# Patient Record
Sex: Female | Born: 1965 | Race: Black or African American | Hispanic: No | Marital: Married | Smoking: Never smoker
Health system: Southern US, Community
[De-identification: ages and names within clinical notes are randomized; demographics above are authoritative.]

## PROBLEM LIST (undated history)

## (undated) DIAGNOSIS — F329 Major depressive disorder, single episode, unspecified: Secondary | ICD-10-CM

## (undated) DIAGNOSIS — G894 Chronic pain syndrome: Secondary | ICD-10-CM

## (undated) DIAGNOSIS — IMO0001 Reserved for inherently not codable concepts without codable children: Secondary | ICD-10-CM

## (undated) DIAGNOSIS — R51 Headache: Secondary | ICD-10-CM

## (undated) DIAGNOSIS — S83207A Unspecified tear of unspecified meniscus, current injury, left knee, initial encounter: Secondary | ICD-10-CM

## (undated) DIAGNOSIS — Z8781 Personal history of (healed) traumatic fracture: Secondary | ICD-10-CM

## (undated) DIAGNOSIS — M1990 Unspecified osteoarthritis, unspecified site: Secondary | ICD-10-CM

## (undated) DIAGNOSIS — F32A Depression, unspecified: Secondary | ICD-10-CM

## (undated) DIAGNOSIS — S83206A Unspecified tear of unspecified meniscus, current injury, right knee, initial encounter: Secondary | ICD-10-CM

## (undated) DIAGNOSIS — Z87820 Personal history of traumatic brain injury: Secondary | ICD-10-CM

## (undated) DIAGNOSIS — R519 Headache, unspecified: Secondary | ICD-10-CM

## (undated) HISTORY — PX: FRACTURE SURGERY: SHX138

---

## 1994-02-09 HISTORY — PX: HALO APPLICATION: SHX1720

## 1994-02-09 HISTORY — PX: OTHER SURGICAL HISTORY: SHX169

## 1998-06-05 HISTORY — PX: TUBAL LIGATION: SHX77

## 2002-06-05 HISTORY — PX: LAPAROSCOPIC CHOLECYSTECTOMY: SUR755

## 2012-03-14 ENCOUNTER — Emergency Department (HOSPITAL_COMMUNITY)
Admission: EM | Admit: 2012-03-14 | Discharge: 2012-03-15 | Disposition: A | Discharge status: Home / Self Care | Payer: Medicaid Other | Attending: Emergency Medicine | Admitting: Emergency Medicine

## 2012-03-14 ENCOUNTER — Encounter (HOSPITAL_COMMUNITY): Payer: Self-pay | Admitting: *Deleted

## 2012-03-14 DIAGNOSIS — M542 Cervicalgia: Secondary | ICD-10-CM | POA: Insufficient documentation

## 2012-03-14 MED ORDER — HYDROCODONE-ACETAMINOPHEN 5-325 MG PO TABS: 2.0000 | ORAL_TABLET | Freq: Three times a day (TID) | ORAL | Status: DC | PRN

## 2012-03-14 MED ORDER — ONDANSETRON 4 MG PO TBDP
4.0000 mg | ORAL_TABLET | Freq: Once | ORAL | Status: AC
  Administered 2012-03-14: 4 mg via ORAL
  Filled 2012-03-14: qty 1

## 2012-03-14 MED ORDER — DIAZEPAM 5 MG PO TABS
5.0000 mg | ORAL_TABLET | Freq: Once | ORAL | Status: AC
  Administered 2012-03-14: 5 mg via ORAL
  Filled 2012-03-14: qty 1

## 2012-03-14 MED ORDER — DEXAMETHASONE 6 MG PO TABS
10.0000 mg | ORAL_TABLET | Freq: Once | ORAL | Status: AC
  Administered 2012-03-14: 10 mg via ORAL
  Filled 2012-03-14: qty 1

## 2012-03-14 MED ORDER — HYDROCODONE-ACETAMINOPHEN 5-325 MG PO TABS
2.0000 | ORAL_TABLET | Freq: Once | ORAL | Status: AC
  Administered 2012-03-14: 2 via ORAL
  Filled 2012-03-14: qty 2

## 2012-03-14 MED ORDER — PROMETHAZINE HCL 25 MG PO TABS
25.0000 mg | ORAL_TABLET | Freq: Once | ORAL | Status: AC
  Administered 2012-03-15: 25 mg via ORAL
  Filled 2012-03-14: qty 1

## 2012-03-14 MED ORDER — DIAZEPAM 5 MG PO TABS: 5.0000 mg | ORAL_TABLET | Freq: Two times a day (BID) | ORAL | Status: DC | PRN

## 2012-03-14 MED ORDER — PROMETHAZINE HCL 25 MG PO TABS: 25.0000 mg | ORAL_TABLET | Freq: Three times a day (TID) | ORAL | Status: DC | PRN
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
## 2012-03-14 NOTE — ED Notes (Signed)
Pt complains of photophobia. Pt reports taking a Midol this afternoon. Last dose of Flexeril at 7 pm. Pt reports no relief with home medications
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
## 2013-03-14 ENCOUNTER — Emergency Department (HOSPITAL_COMMUNITY): Payer: Self-pay

## 2013-03-14 ENCOUNTER — Encounter (HOSPITAL_COMMUNITY): Payer: Self-pay | Admitting: Emergency Medicine

## 2013-03-14 DIAGNOSIS — M7989 Other specified soft tissue disorders: Secondary | ICD-10-CM | POA: Insufficient documentation

## 2013-03-14 DIAGNOSIS — Z8781 Personal history of (healed) traumatic fracture: Secondary | ICD-10-CM | POA: Insufficient documentation

## 2013-03-14 DIAGNOSIS — R5381 Other malaise: Secondary | ICD-10-CM | POA: Insufficient documentation

## 2013-03-14 DIAGNOSIS — Z79899 Other long term (current) drug therapy: Secondary | ICD-10-CM | POA: Insufficient documentation

## 2013-03-14 DIAGNOSIS — E119 Type 2 diabetes mellitus without complications: Secondary | ICD-10-CM | POA: Insufficient documentation

## 2013-03-14 DIAGNOSIS — N898 Other specified noninflammatory disorders of vagina: Secondary | ICD-10-CM | POA: Insufficient documentation

## 2013-03-14 DIAGNOSIS — R072 Precordial pain: Secondary | ICD-10-CM | POA: Insufficient documentation

## 2013-03-14 DIAGNOSIS — R0602 Shortness of breath: Secondary | ICD-10-CM | POA: Insufficient documentation

## 2013-03-14 DIAGNOSIS — I10 Essential (primary) hypertension: Secondary | ICD-10-CM | POA: Insufficient documentation

## 2013-03-14 LAB — BASIC METABOLIC PANEL
BUN: 12 mg/dL (ref 6–23)
CO2: 23 mEq/L (ref 19–32)
Chloride: 104 mEq/L (ref 96–112)
GFR calc Af Amer: 89 mL/min — ABNORMAL LOW (ref >90)
Potassium: 3.6 mEq/L (ref 3.5–5.1)

## 2013-03-14 LAB — CBC
HCT: 34.2 % — ABNORMAL LOW (ref 36.0–46.0)
Platelets: 246 10*3/uL (ref 150–400)
RBC: 4.36 MIL/uL (ref 3.87–5.11)
RDW: 14.7 % (ref 11.5–15.5)
WBC: 6.1 10*3/uL (ref 4.0–10.5)

## 2013-03-14 LAB — POCT I-STAT TROPONIN I

## 2013-03-14 LAB — LIPASE, BLOOD: Lipase: 45 U/L (ref 11–59)

## 2013-03-14 NOTE — ED Notes (Signed)
Presents with fatigue, left sided chest pain under the breast, increased thirst and increased urination associated with SOB, also reports vaginal bleeding after intercourse and upper abdominal pain for the past 2 weeks.  aslo reports dizziness and having to elevate head of bedn when lying down to feeling SOB, blurred vision.

## 2013-03-15 ENCOUNTER — Emergency Department (HOSPITAL_COMMUNITY): Payer: Medicaid Other

## 2013-03-15 ENCOUNTER — Emergency Department (HOSPITAL_COMMUNITY)
Admission: EM | Admit: 2013-03-15 | Discharge: 2013-03-15 | Disposition: A | Discharge status: Home / Self Care | Payer: Self-pay | Attending: Emergency Medicine | Admitting: Emergency Medicine

## 2013-03-15 ENCOUNTER — Encounter (HOSPITAL_COMMUNITY): Payer: Medicaid Other

## 2013-03-15 ENCOUNTER — Ambulatory Visit (HOSPITAL_COMMUNITY)
Admission: RE | Admit: 2013-03-15 | Discharge: 2013-03-15 | Disposition: A | Discharge status: Home / Self Care | Payer: Medicaid Other | Source: Ambulatory Visit | Attending: Emergency Medicine | Admitting: Emergency Medicine

## 2013-03-15 DIAGNOSIS — M7989 Other specified soft tissue disorders: Secondary | ICD-10-CM

## 2013-03-15 DIAGNOSIS — R079 Chest pain, unspecified: Secondary | ICD-10-CM

## 2013-03-15 LAB — URINALYSIS, ROUTINE W REFLEX MICROSCOPIC
Bilirubin Urine: NEGATIVE
Hgb urine dipstick: NEGATIVE
Protein, ur: NEGATIVE mg/dL
Urobilinogen, UA: 0.2 mg/dL (ref 0.0–1.0)

## 2013-03-15 LAB — GLUCOSE, CAPILLARY: Glucose-Capillary: 87 mg/dL (ref 70–99)

## 2013-03-15 MED ORDER — ASPIRIN 81 MG PO CHEW: 81.0000 mg | CHEWABLE_TABLET | Freq: Every day | ORAL | Status: DC

## 2013-03-15 MED ORDER — IOHEXOL 350 MG/ML SOLN
100.0000 mL | Freq: Once | INTRAVENOUS | Status: AC | PRN
  Administered 2013-03-15: 100 mL via INTRAVENOUS

## 2013-03-15 MED ORDER — SODIUM CHLORIDE 0.9 % IV BOLUS (SEPSIS)
1000.0000 mL | Freq: Once | INTRAVENOUS | Status: AC
  Administered 2013-03-15: 1000 mL via INTRAVENOUS

## 2013-03-15 MED ORDER — ASPIRIN 81 MG PO CHEW
162.0000 mg | CHEWABLE_TABLET | Freq: Once | ORAL | Status: AC
  Administered 2013-03-15: 162 mg via ORAL
  Filled 2013-03-15: qty 2

## 2013-03-15 MED ORDER — HYDROCODONE-ACETAMINOPHEN 5-325 MG PO TABS: 1.0000 | ORAL_TABLET | Freq: Four times a day (QID) | ORAL | Status: DC | PRN

## 2013-03-15 MED ORDER — HYDROCODONE-ACETAMINOPHEN 5-325 MG PO TABS
2.0000 | ORAL_TABLET | Freq: Once | ORAL | Status: AC
  Administered 2013-03-15: 2 via ORAL
  Filled 2013-03-15: qty 2
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
## 2013-08-25 ENCOUNTER — Emergency Department (HOSPITAL_COMMUNITY)
Admission: EM | Admit: 2013-08-25 | Discharge: 2013-08-26 | Disposition: A | Discharge status: Home / Self Care | Payer: 59 | Attending: Emergency Medicine | Admitting: Emergency Medicine

## 2013-08-25 ENCOUNTER — Emergency Department (HOSPITAL_COMMUNITY): Payer: 59

## 2013-08-25 ENCOUNTER — Encounter (HOSPITAL_COMMUNITY): Payer: Self-pay | Admitting: Emergency Medicine

## 2013-08-25 DIAGNOSIS — Z79899 Other long term (current) drug therapy: Secondary | ICD-10-CM | POA: Insufficient documentation

## 2013-08-25 DIAGNOSIS — R42 Dizziness and giddiness: Secondary | ICD-10-CM | POA: Insufficient documentation

## 2013-08-25 DIAGNOSIS — R0789 Other chest pain: Secondary | ICD-10-CM | POA: Insufficient documentation

## 2013-08-25 DIAGNOSIS — Z87828 Personal history of other (healed) physical injury and trauma: Secondary | ICD-10-CM | POA: Insufficient documentation

## 2013-08-25 LAB — COMPREHENSIVE METABOLIC PANEL
ALK PHOS: 70 U/L (ref 39–117)
ALT: 19 U/L (ref 0–35)
AST: 19 U/L (ref 0–37)
Albumin: 3.8 g/dL (ref 3.5–5.2)
BILIRUBIN TOTAL: 1 mg/dL (ref 0.3–1.2)
BUN: 9 mg/dL (ref 6–23)
CO2: 20 meq/L (ref 19–32)
CREATININE: 0.81 mg/dL (ref 0.50–1.10)
Calcium: 9.6 mg/dL (ref 8.4–10.5)
Chloride: 102 mEq/L (ref 96–112)
GFR, EST NON AFRICAN AMERICAN: 86 mL/min — AB (ref >90)
Glucose, Bld: 78 mg/dL (ref 70–99)
POTASSIUM: 4.2 meq/L (ref 3.7–5.3)
Sodium: 138 mEq/L (ref 137–147)
Total Protein: 7.3 g/dL (ref 6.0–8.3)

## 2013-08-25 LAB — CBC
HEMATOCRIT: 36.5 % (ref 36.0–46.0)
Hemoglobin: 13.1 g/dL (ref 12.0–15.0)
MCH: 28.5 pg (ref 26.0–34.0)
MCHC: 35.9 g/dL (ref 30.0–36.0)
MCV: 79.3 fL (ref 78.0–100.0)
Platelets: 193 10*3/uL (ref 150–400)
RBC: 4.6 MIL/uL (ref 3.87–5.11)
RDW: 15.3 % (ref 11.5–15.5)
WBC: 6.9 10*3/uL (ref 4.0–10.5)

## 2013-08-25 LAB — URINE MICROSCOPIC-ADD ON

## 2013-08-25 LAB — URINALYSIS, ROUTINE W REFLEX MICROSCOPIC
BILIRUBIN URINE: NEGATIVE
Glucose, UA: NEGATIVE mg/dL
HGB URINE DIPSTICK: NEGATIVE
KETONES UR: NEGATIVE mg/dL
NITRITE: NEGATIVE
PROTEIN: NEGATIVE mg/dL
SPECIFIC GRAVITY, URINE: 1.019 (ref 1.005–1.030)
UROBILINOGEN UA: 0.2 mg/dL (ref 0.0–1.0)
pH: 6 (ref 5.0–8.0)

## 2013-08-25 LAB — TROPONIN I

## 2013-08-25 LAB — D-DIMER, QUANTITATIVE: D-Dimer, Quant: 0.58 ug/mL-FEU — ABNORMAL HIGH (ref 0.00–0.48)

## 2013-08-25 LAB — I-STAT TROPONIN, ED: TROPONIN I, POC: 0 ng/mL (ref 0.00–0.08)

## 2013-08-25 MED ORDER — GI COCKTAIL ~~LOC~~
30.0000 mL | Freq: Once | ORAL | Status: AC
  Administered 2013-08-25: 30 mL via ORAL
  Filled 2013-08-25: qty 30

## 2013-08-25 MED ORDER — IBUPROFEN 800 MG PO TABS: 800.0000 mg | ORAL_TABLET | Freq: Three times a day (TID) | ORAL | Status: DC

## 2013-08-25 MED ORDER — ASPIRIN 81 MG PO CHEW
324.0000 mg | CHEWABLE_TABLET | Freq: Once | ORAL | Status: AC
  Administered 2013-08-25: 324 mg via ORAL
  Filled 2013-08-25: qty 4

## 2013-08-25 MED ORDER — IOHEXOL 350 MG/ML SOLN
100.0000 mL | Freq: Once | INTRAVENOUS | Status: AC | PRN
  Administered 2013-08-25: 100 mL via INTRAVENOUS

## 2013-08-25 NOTE — ED Notes (Signed)
Patient transported to CT 

## 2013-08-25 NOTE — ED Notes (Signed)
IV team at bedside 

## 2013-08-25 NOTE — ED Notes (Signed)
Unsuccessful ultrasound guided IV attempts x 2. EDP aware

## 2013-08-25 NOTE — ED Notes (Signed)
Ultrasound IV unsuccessful, Dr. Wyvonnia Dusky notified.

## 2013-08-25 NOTE — ED Notes (Signed)
Pt urinated but forgot to give specimen, states she will try again later.  20 gauge IV attempted X 1 without success IV team paged.

## 2013-08-25 NOTE — Discharge Instructions (Signed)
Chest Wall Pain There is no evidence of heart attack or blood clot in the lung. Follow up with your doctor. Return to the ED if you develop new or worsening symptoms. Chest wall pain is pain in or around the bones and muscles of your chest. It may take up to 6 weeks to get better. It may take longer if you must stay physically active in your work and activities.  CAUSES  Chest wall pain may happen on its own. However, it may be caused by:  A viral illness like the flu.  Injury.  Coughing.  Exercise.  Arthritis.  Fibromyalgia.  Shingles. HOME CARE INSTRUCTIONS   Avoid overtiring physical activity. Try not to strain or perform activities that cause pain. This includes any activities using your chest or your abdominal and side muscles, especially if heavy weights are used.  Put ice on the sore area.  Put ice in a plastic bag.  Place a towel between your skin and the bag.  Leave the ice on for 15-20 minutes per hour while awake for the first 2 days.  Only take over-the-counter or prescription medicines for pain, discomfort, or fever as directed by your caregiver. SEEK IMMEDIATE MEDICAL CARE IF:   Your pain increases, or you are very uncomfortable.  You have a fever.  Your chest pain becomes worse.  You have new, unexplained symptoms.  You have nausea or vomiting.  You feel sweaty or lightheaded.  You have a cough with phlegm (sputum), or you cough up blood. MAKE SURE YOU:   Understand these instructions.  Will watch your condition.  Will get help right away if you are not doing well or get worse. Document Released: 05/22/2005 Document Revised: 08/14/2011 Document Reviewed: 01/16/2011 [facility] Patient Information 2014 [place], [place].
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
## 2013-08-25 NOTE — ED Notes (Signed)
Hard sharp cp that just started today while she was visiting a friend at hospital no n/v  Some sob

## 2013-08-25 NOTE — ED Notes (Addendum)
Flushed pt IV, pt denies pain. Called CT to inform pt agreeing to go back to CT for scan.

## 2013-08-25 NOTE — ED Notes (Signed)
Paged IV team for 20G placement for CT angio.

## 2013-08-25 NOTE — ED Notes (Signed)
Pt returned from CT. Was unable to perform scan because pt reports pain while flushing IV. CT denies IV infiltrated.

## 2013-08-25 NOTE — ED Notes (Signed)
Called CT to inform pt has 20G IV and ready for scan.

## 2013-08-25 NOTE — ED Notes (Addendum)
Randall Hiss, RN at bedside attempting to insert IV via ultrasound.

## 2013-08-25 NOTE — ED Notes (Signed)
Pt returned from CT. Unable to perform scan due to pain when flushing IV. Dr. Wyvonnia Dusky notified.

## 2013-08-26 ENCOUNTER — Emergency Department (HOSPITAL_COMMUNITY): Payer: 59

## 2013-08-26 MED ORDER — TECHNETIUM TC 99M DIETHYLENETRIAME-PENTAACETIC ACID: 40.0000 | Freq: Once | INTRAVENOUS | Status: DC | PRN

## 2013-08-26 MED ORDER — TECHNETIUM TO 99M ALBUMIN AGGREGATED
6.0000 | Freq: Once | INTRAVENOUS | Status: AC | PRN
  Administered 2013-08-26: 6 via INTRAVENOUS

## 2013-08-26 NOTE — ED Notes (Signed)
Pt back from NM.

## 2013-08-26 NOTE — ED Provider Notes (Signed)
Nursing notes and vitals signs, including pulse oximetry, reviewed.  Summary of this visit's results, reviewed by myself:  Labs:  Results for orders placed during the hospital encounter of 08/25/13 (from the past 24 hour(s))  CBC     Status: None   Collection Time    08/25/13  3:29 PM      Result Value Ref Range   WBC 6.9  4.0 - 10.5 K/uL   RBC 4.60  3.87 - 5.11 MIL/uL   Hemoglobin 13.1  12.0 - 15.0 g/dL   HCT 36.5  36.0 - 46.0 %   MCV 79.3  78.0 - 100.0 fL   MCH 28.5  26.0 - 34.0 pg   MCHC 35.9  30.0 - 36.0 g/dL   RDW 15.3  11.5 - 15.5 %   Platelets 193  150 - 400 K/uL  COMPREHENSIVE METABOLIC PANEL     Status: Abnormal   Collection Time    08/25/13  3:29 PM      Result Value Ref Range   Sodium 138  137 - 147 mEq/L   Potassium 4.2  3.7 - 5.3 mEq/L   Chloride 102  96 - 112 mEq/L   CO2 20  19 - 32 mEq/L   Glucose, Bld 78  70 - 99 mg/dL   BUN 9  6 - 23 mg/dL   Creatinine, Ser 0.81  0.50 - 1.10 mg/dL   Calcium 9.6  8.4 - 10.5 mg/dL   Total Protein 7.3  6.0 - 8.3 g/dL   Albumin 3.8  3.5 - 5.2 g/dL   AST 19  0 - 37 U/L   ALT 19  0 - 35 U/L   Alkaline Phosphatase 70  39 - 117 U/L   Total Bilirubin 1.0  0.3 - 1.2 mg/dL   GFR calc non Af Amer 86 (*) >90 mL/min   GFR calc Af Amer >90  >90 mL/min  I-STAT TROPOININ, ED     Status: None   Collection Time    08/25/13  4:03 PM      Result Value Ref Range   Troponin i, poc 0.00  0.00 - 0.08 ng/mL   Comment 3           D-DIMER, QUANTITATIVE     Status: Abnormal   Collection Time    08/25/13  4:06 PM      Result Value Ref Range   D-Dimer, Quant 0.58 (*) 0.00 - 0.48 ug/mL-FEU  TROPONIN I     Status: None   Collection Time    08/25/13  6:44 PM      Result Value Ref Range   Troponin I <0.30  <0.30 ng/mL  URINALYSIS, ROUTINE W REFLEX MICROSCOPIC     Status: Abnormal   Collection Time    08/25/13  6:50 PM      Result Value Ref Range   Color, Urine YELLOW  YELLOW   APPearance CLEAR  CLEAR   Specific Gravity, Urine 1.019  1.005 -  1.030   pH 6.0  5.0 - 8.0   Glucose, UA NEGATIVE  NEGATIVE mg/dL   Hgb urine dipstick NEGATIVE  NEGATIVE   Bilirubin Urine NEGATIVE  NEGATIVE   Ketones, ur NEGATIVE  NEGATIVE mg/dL   Protein, ur NEGATIVE  NEGATIVE mg/dL   Urobilinogen, UA 0.2  0.0 - 1.0 mg/dL   Nitrite NEGATIVE  NEGATIVE   Leukocytes, UA SMALL (*) NEGATIVE  URINE MICROSCOPIC-ADD ON     Status: Abnormal   Collection Time  08/25/13  6:50 PM      Result Value Ref Range   Squamous Epithelial / LPF FEW (*) RARE   WBC, UA 3-6  <3 WBC/hpf   Bacteria, UA FEW (*) RARE   Urine-Other MUCOUS PRESENT      Imaging Studies: Dg Chest 2 View  08/25/2013   CLINICAL DATA:  Midsternal chest Pain began several hr ago.  EXAM: CHEST  2 VIEW  COMPARISON:  03/14/2013 and 03/15/2013  FINDINGS: The heart size and mediastinal contours are within normal limits. Both lungs are clear. The visualized skeletal structures are unremarkable. Surgical clips are noted in the upper abdomen.  IMPRESSION: No active cardiopulmonary disease.   Electronically Signed   By: Shon Hale M.D.   On: 08/25/2013 17:15   Nm Pulmonary Perf And Vent  08/26/2013   CLINICAL DATA:  Pain, shortness of breath.  EXAM: NUCLEAR MEDICINE VENTILATION - PERFUSION LUNG SCAN  TECHNIQUE: Ventilation images were obtained in multiple projections using inhaled aerosol technetium 99 M DTPA. Perfusion images were obtained in multiple projections after intravenous injection of Tc-2m MAA.  RADIOPHARMACEUTICALS:  40 mCi Tc-7m DTPA aerosol and 6 mCi Tc-42m MAA  COMPARISON:  DG CHEST 2 VIEW dated 08/25/2013; CT ANGIO CHEST W/CM &/OR WO/CM dated 03/15/2013  FINDINGS: Ventilation: No focal ventilation defect.  Perfusion: No wedge shaped peripheral perfusion defects to suggest acute pulmonary embolism. Non segmental matched ventilation and perfusion defect in the right mid lung could reflect fissure though is non specific.  IMPRESSION: Low probability V/Q scan.   Electronically Signed   By: Elon Alas   On: 08/26/2013 01:46      Wynetta Fines, MD 08/26/13 2706

## 2013-08-26 NOTE — ED Notes (Signed)
Pt taken to Nuclear med for VQ scan.

## 2013-08-26 NOTE — ED Notes (Signed)
Pt upset at discharge due to being told by MD that she was going to be kept.  This RN and MD explained to pt that there was no need to keep her in emergency department.  Pt unable to get into contact with husband for pickup.  This RN offered pt assistance in getting home.  Unable to do so due to limited resources.  Pt wanting to speak with MD again.  MD unavailable.  Pt left department visibly upset.  Agricultural consultant and AD notified.

## 2013-08-28 ENCOUNTER — Other Ambulatory Visit: Payer: Self-pay | Admitting: Orthopedic Surgery

## 2013-08-29 ENCOUNTER — Encounter (HOSPITAL_BASED_OUTPATIENT_CLINIC_OR_DEPARTMENT_OTHER): Payer: Self-pay | Admitting: *Deleted

## 2013-09-02 ENCOUNTER — Encounter (HOSPITAL_BASED_OUTPATIENT_CLINIC_OR_DEPARTMENT_OTHER): Payer: Self-pay | Admitting: *Deleted

## 2013-09-02 NOTE — Progress Notes (Addendum)
NPO AFTER MN WITH EXCEPTION CLEAR LIQUIDS UNTIL 0700 (NO CREAM/ MILK PRODUCTS).  ARRIVE AT 1115.  CURRENT LAB RESULTS IN EPIC AND CHART.   WILL TAKE FLEXERIL AND TRAMADOL AM DOS W/ SIPS OF WATER. 

## 2013-09-09 ENCOUNTER — Encounter (HOSPITAL_BASED_OUTPATIENT_CLINIC_OR_DEPARTMENT_OTHER): Payer: Self-pay | Admitting: Anesthesiology

## 2013-09-09 ENCOUNTER — Encounter (HOSPITAL_BASED_OUTPATIENT_CLINIC_OR_DEPARTMENT_OTHER): Payer: 59 | Admitting: Anesthesiology

## 2013-09-09 ENCOUNTER — Ambulatory Visit (HOSPITAL_BASED_OUTPATIENT_CLINIC_OR_DEPARTMENT_OTHER): Payer: 59 | Admitting: Anesthesiology

## 2013-09-09 ENCOUNTER — Encounter (HOSPITAL_BASED_OUTPATIENT_CLINIC_OR_DEPARTMENT_OTHER)
Admission: RE | Disposition: A | Discharge status: Home / Self Care | Payer: Self-pay | Source: Ambulatory Visit | Attending: Specialist

## 2013-09-09 ENCOUNTER — Ambulatory Visit (HOSPITAL_BASED_OUTPATIENT_CLINIC_OR_DEPARTMENT_OTHER)
Admission: RE | Admit: 2013-09-09 | Discharge: 2013-09-09 | Disposition: A | Discharge status: Home / Self Care | Payer: 59 | Source: Ambulatory Visit | Attending: Specialist | Admitting: Specialist

## 2013-09-09 DIAGNOSIS — X58XXXA Exposure to other specified factors, initial encounter: Secondary | ICD-10-CM | POA: Insufficient documentation

## 2013-09-09 DIAGNOSIS — Z9889 Other specified postprocedural states: Secondary | ICD-10-CM

## 2013-09-09 DIAGNOSIS — IMO0002 Reserved for concepts with insufficient information to code with codable children: Secondary | ICD-10-CM | POA: Insufficient documentation

## 2013-09-09 DIAGNOSIS — M1710 Unilateral primary osteoarthritis, unspecified knee: Secondary | ICD-10-CM | POA: Insufficient documentation

## 2013-09-09 DIAGNOSIS — M2240 Chondromalacia patellae, unspecified knee: Secondary | ICD-10-CM | POA: Insufficient documentation

## 2013-09-09 DIAGNOSIS — E669 Obesity, unspecified: Secondary | ICD-10-CM | POA: Insufficient documentation

## 2013-09-09 HISTORY — DX: Reserved for inherently not codable concepts without codable children: IMO0001

## 2013-09-09 HISTORY — DX: Chronic pain syndrome: G89.4

## 2013-09-09 HISTORY — DX: Personal history of traumatic brain injury: Z87.820

## 2013-09-09 HISTORY — DX: Unspecified tear of unspecified meniscus, current injury, left knee, initial encounter: S83.207A

## 2013-09-09 HISTORY — DX: Personal history of (healed) traumatic fracture: Z87.81

## 2013-09-09 HISTORY — PX: KNEE ARTHROSCOPY WITH MEDIAL MENISECTOMY: SHX5651

## 2013-09-09 SURGERY — ARTHROSCOPY, KNEE, WITH MEDIAL MENISCECTOMY
Anesthesia: General | Site: Knee | Laterality: Left

## 2013-09-09 MED ORDER — ONDANSETRON HCL 4 MG/2ML IJ SOLN
INTRAMUSCULAR | Status: DC | PRN
  Administered 2013-09-09: 4 mg via INTRAVENOUS

## 2013-09-09 MED ORDER — CHLORHEXIDINE GLUCONATE 4 % EX LIQD
60.0000 mL | Freq: Once | CUTANEOUS | Status: DC
  Filled 2013-09-09: qty 60

## 2013-09-09 MED ORDER — DEXAMETHASONE SODIUM PHOSPHATE 4 MG/ML IJ SOLN
INTRAMUSCULAR | Status: DC | PRN
  Administered 2013-09-09: 10 mg via INTRAVENOUS

## 2013-09-09 MED ORDER — SODIUM CHLORIDE 0.9 % IR SOLN
Status: DC | PRN
  Administered 2013-09-09: 6000 mL

## 2013-09-09 MED ORDER — LACTATED RINGERS IV SOLN
INTRAVENOUS | Status: DC
  Administered 2013-09-09: 07:00:00 via INTRAVENOUS
  Filled 2013-09-09: qty 1000

## 2013-09-09 MED ORDER — ACETAMINOPHEN 10 MG/ML IV SOLN
INTRAVENOUS | Status: DC | PRN
  Administered 2013-09-09: 1000 mg via INTRAVENOUS

## 2013-09-09 MED ORDER — MORPHINE SULFATE 4 MG/ML IJ SOLN
INTRAMUSCULAR | Status: AC
  Filled 2013-09-09: qty 1

## 2013-09-09 MED ORDER — FENTANYL CITRATE 0.05 MG/ML IJ SOLN
INTRAMUSCULAR | Status: AC
  Filled 2013-09-09: qty 2

## 2013-09-09 MED ORDER — SODIUM CHLORIDE 0.9 % IV SOLN
INTRAVENOUS | Status: DC
  Filled 2013-09-09: qty 1000

## 2013-09-09 MED ORDER — FENTANYL CITRATE 0.05 MG/ML IJ SOLN
INTRAMUSCULAR | Status: AC
  Filled 2013-09-09: qty 4

## 2013-09-09 MED ORDER — FENTANYL CITRATE 0.05 MG/ML IJ SOLN
25.0000 ug | INTRAMUSCULAR | Status: DC | PRN
  Administered 2013-09-09 (×2): 50 ug via INTRAVENOUS
  Filled 2013-09-09: qty 1

## 2013-09-09 MED ORDER — LIDOCAINE HCL (CARDIAC) 20 MG/ML IV SOLN
INTRAVENOUS | Status: DC | PRN
  Administered 2013-09-09: 100 mg via INTRAVENOUS

## 2013-09-09 MED ORDER — CEFAZOLIN SODIUM-DEXTROSE 2-3 GM-% IV SOLR
2.0000 g | INTRAVENOUS | Status: AC
  Administered 2013-09-09: 2 g via INTRAVENOUS
  Filled 2013-09-09: qty 50

## 2013-09-09 MED ORDER — LACTATED RINGERS IV SOLN
INTRAVENOUS | Status: DC
  Administered 2013-09-09: 10:00:00 via INTRAVENOUS
  Filled 2013-09-09: qty 1000

## 2013-09-09 MED ORDER — HYDROCODONE-ACETAMINOPHEN 5-325 MG PO TABS: 1.0000 | ORAL_TABLET | Freq: Four times a day (QID) | ORAL | Status: DC | PRN

## 2013-09-09 MED ORDER — ASPIRIN EC 325 MG PO TBEC: 325.0000 mg | DELAYED_RELEASE_TABLET | Freq: Two times a day (BID) | ORAL | Status: DC

## 2013-09-09 MED ORDER — KETOROLAC TROMETHAMINE 30 MG/ML IJ SOLN
INTRAMUSCULAR | Status: DC | PRN
  Administered 2013-09-09: 30 mg via INTRAVENOUS

## 2013-09-09 MED ORDER — CEPHALEXIN 500 MG PO CAPS: 500.0000 mg | ORAL_CAPSULE | Freq: Three times a day (TID) | ORAL | Status: DC

## 2013-09-09 MED ORDER — MIDAZOLAM HCL 2 MG/2ML IJ SOLN
INTRAMUSCULAR | Status: AC
  Filled 2013-09-09: qty 2

## 2013-09-09 MED ORDER — MIDAZOLAM HCL 5 MG/5ML IJ SOLN
INTRAMUSCULAR | Status: DC | PRN
  Administered 2013-09-09: 2 mg via INTRAVENOUS

## 2013-09-09 MED ORDER — BUPIVACAINE HCL 0.25 % IJ SOLN
INTRAMUSCULAR | Status: DC | PRN
  Administered 2013-09-09: 25 mL

## 2013-09-09 MED ORDER — FENTANYL CITRATE 0.05 MG/ML IJ SOLN
INTRAMUSCULAR | Status: DC | PRN
  Administered 2013-09-09 (×2): 50 ug via INTRAVENOUS

## 2013-09-09 MED ORDER — MORPHINE SULFATE 4 MG/ML IJ SOLN
INTRAMUSCULAR | Status: DC | PRN
Start: 2013-09-09 — End: 2013-09-09
  Administered 2013-09-09: 4 mg via INTRAVENOUS

## 2013-09-09 MED ORDER — PROPOFOL 10 MG/ML IV BOLUS
INTRAVENOUS | Status: DC | PRN
  Administered 2013-09-09: 200 mg via INTRAVENOUS

## 2013-09-09 SURGICAL SUPPLY — 53 items
BANDAGE ESMARK 6X9 LF (GAUZE/BANDAGES/DRESSINGS) ×1 IMPLANT
BANDAGE GAUZE ELAST BULKY 4 IN (GAUZE/BANDAGES/DRESSINGS) ×2 IMPLANT
BLADE 4.2CUDA (BLADE) IMPLANT
BLADE CUDA GRT WHITE 3.5 (BLADE) ×2 IMPLANT
BLADE CUDA SHAVER 3.5 (BLADE) IMPLANT
BNDG ESMARK 6X9 LF (GAUZE/BANDAGES/DRESSINGS) ×2
BNDG GAUZE ELAST 4 BULKY (GAUZE/BANDAGES/DRESSINGS) ×4 IMPLANT
CANISTER SUCT LVC 12 LTR MEDI- (MISCELLANEOUS) ×2 IMPLANT
CANISTER SUCTION 1200CC (MISCELLANEOUS) ×2 IMPLANT
CLOTH BEACON ORANGE TIMEOUT ST (SAFETY) ×2 IMPLANT
CUFF TOURNIQUET SINGLE 34IN LL (TOURNIQUET CUFF) ×2 IMPLANT
DRAPE ARTHROSCOPY W/POUCH 114 (DRAPES) ×2 IMPLANT
DRAPE INCISE 23X17 IOBAN STRL (DRAPES) ×1
DRAPE INCISE IOBAN 23X17 STRL (DRAPES) ×1 IMPLANT
DRAPE INCISE IOBAN 66X45 STRL (DRAPES) ×2 IMPLANT
DRSG PAD ABDOMINAL 8X10 ST (GAUZE/BANDAGES/DRESSINGS) ×2 IMPLANT
DURAPREP 26ML APPLICATOR (WOUND CARE) ×2 IMPLANT
ELECT MENISCUS 165MM 90D (ELECTRODE) IMPLANT
ELECT REM PT RETURN 9FT ADLT (ELECTROSURGICAL)
ELECTRODE REM PT RTRN 9FT ADLT (ELECTROSURGICAL) IMPLANT
GAUZE XEROFORM 1X8 LF (GAUZE/BANDAGES/DRESSINGS) ×2 IMPLANT
GLOVE BIO SURGEON STRL SZ7.5 (GLOVE) ×2 IMPLANT
GLOVE BIOGEL M 6.5 STRL (GLOVE) ×2 IMPLANT
GLOVE BIOGEL PI IND STRL 6.5 (GLOVE) ×1 IMPLANT
GLOVE BIOGEL PI INDICATOR 6.5 (GLOVE) ×1
GLOVE INDICATOR 8.0 STRL GRN (GLOVE) ×4 IMPLANT
GLOVE SURG ORTHO 8.0 STRL STRW (GLOVE) ×2 IMPLANT
GOWN PREVENTION PLUS LG XLONG (DISPOSABLE) IMPLANT
GOWN STRL REIN XL XLG (GOWN DISPOSABLE) IMPLANT
GOWN STRL REUS W/TWL LRG LVL3 (GOWN DISPOSABLE) ×2 IMPLANT
GOWN STRL REUS W/TWL XL LVL3 (GOWN DISPOSABLE) ×4 IMPLANT
IMMOBILIZER KNEE 22 UNIV (SOFTGOODS) IMPLANT
IMMOBILIZER KNEE 24 THIGH 36 (MISCELLANEOUS) IMPLANT
IMMOBILIZER KNEE 24 UNIV (MISCELLANEOUS)
KNEE WRAP E Z 3 GEL PACK (MISCELLANEOUS) ×2 IMPLANT
MINI VAC (SURGICAL WAND) ×2 IMPLANT
NEEDLE HYPO 22GX1.5 SAFETY (NEEDLE) ×2 IMPLANT
PACK ARTHROSCOPY DSU (CUSTOM PROCEDURE TRAY) ×2 IMPLANT
PACK BASIN DAY SURGERY FS (CUSTOM PROCEDURE TRAY) ×2 IMPLANT
PAD ABD 8X10 STRL (GAUZE/BANDAGES/DRESSINGS) ×4 IMPLANT
PADDING CAST ABS 4INX4YD NS (CAST SUPPLIES) ×1
PADDING CAST ABS COTTON 4X4 ST (CAST SUPPLIES) ×1 IMPLANT
PENCIL BUTTON HOLSTER BLD 10FT (ELECTRODE) IMPLANT
SET ARTHROSCOPY TUBING (MISCELLANEOUS) ×1
SET ARTHROSCOPY TUBING LN (MISCELLANEOUS) ×1 IMPLANT
SPONGE GAUZE 4X4 12PLY (GAUZE/BANDAGES/DRESSINGS) ×2 IMPLANT
SPONGE GAUZE 4X4 12PLY STER LF (GAUZE/BANDAGES/DRESSINGS) ×2 IMPLANT
SUT ETHILON 4 0 PS 2 18 (SUTURE) ×2 IMPLANT
SYR CONTROL 10ML LL (SYRINGE) ×2 IMPLANT
TOWEL OR 17X24 6PK STRL BLUE (TOWEL DISPOSABLE) ×2 IMPLANT
WAND 30 DEG SABER W/CORD (SURGICAL WAND) IMPLANT
WAND 90 DEG TURBOVAC W/CORD (SURGICAL WAND) IMPLANT
WATER STERILE IRR 500ML POUR (IV SOLUTION) ×2 IMPLANT
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
## 2013-09-09 NOTE — Op Note (Signed)
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 Procedure 1 left knee arthroscopic partial medial meniscectomy #2 chondroplasty of medial femoral condyle Surgeon Hart Robinsons M.D. Assistant Jerilynn Mages PA-C Anesthesia Gen. intraoperative knee block Estimated blood loss minimal Tourniquet time 30 minutes 300 mm of mercury Complications none Disposition PACU stable  Operative details patient and husband counseled in the holding area cracks out marked. IV antibiotics given no way to operating room. In the OR placed supine placed under general anesthesia. And a thigh patient to thigh holder prepped with DuraPrep and draped into sterile fashion. Gershon Mussel out was done. Arthroscopic portals were established proximal medial inferomedial inferolateral. Diagnostic arthroscopy the patellofemoral joint but normal tracking and mild grade 2, weighs of the proximal aspect and a light chondroplasty was performed femoral trochlea unremarkable souped L. pouch medial lateral gutters unremarkable anterior posterior cruciate normal medial compartment inspected diffuse grade 3 chondromalacia medial femoral condyle unstable chondral flaps mechanical chondroplasty performed with a shaver. Complex tear posterior half of medial meniscus utilizing baskets and motorized shaver and cautery system a partial medial meniscectomy was performed smoothing back to a nice smooth edges well beveled and well contoured. Lateral side inspected lateral femoral condyle unremarkable lateral tibial plateau mild chondromalacia did not require chondroplasty lateral meniscus thoroughly intact arthroscopically. No other abnormalities noted irrigated arthroscopic equipment was removed. 5 cc of [place] sooner 0.25% Marcaine was placed into the skin and 20 cc of 0.25%  Marcaine with 4 mg of morphine sulfate was placed intra-articular. Sterile dressing was applied TED hose tourniquet was deflated normal circulation. Awakened taken from operating room to PACU in stable condition. No complications no problems.

## 2013-09-09 NOTE — Anesthesia Postprocedure Evaluation (Signed)
  Anesthesia Post-op Note  Patient: Latasha [name]  Procedure(s) Performed: Procedure(s) (LRB): LEFT KNEE ARTHROSCOPY WITH PARTIAL MEDIAL  MENISECTOMY AND CHONDROPLASTY (Left)  Patient Location: PACU  Anesthesia Type: General  Level of Consciousness: awake and alert   Airway and Oxygen Therapy: Patient Spontanous Breathing  Post-op Pain: mild  Post-op Assessment: Post-op Vital signs reviewed, Patient's Cardiovascular Status Stable, Respiratory Function Stable, Patent Airway and No signs of Nausea or vomiting  Last Vitals:  Filed Vitals:   09/09/13 0915  BP: 137/101  Pulse: 74  Temp:   Resp: 12    Post-op Vital Signs: stable   Complications: No apparent anesthesia complications

## 2013-09-09 NOTE — Anesthesia Procedure Notes (Signed)
Procedure Name: LMA Insertion Date/Time: 09/09/2013 8:00 AM Performed by: Mechele Claude Pre-anesthesia Checklist: Patient identified, Emergency Drugs available, Suction available and Patient being monitored Patient Re-evaluated:Patient Re-evaluated prior to inductionOxygen Delivery Method: Circle System Utilized Preoxygenation: Pre-oxygenation with 100% oxygen Intubation Type: IV induction Ventilation: Mask ventilation without difficulty LMA: LMA inserted LMA Size: 4.0 Number of attempts: 1 Airway Equipment and Method: bite block Placement Confirmation: positive ETCO2 Tube secured with: Tape Dental Injury: Teeth and Oropharynx as per pre-operative assessment

## 2013-09-09 NOTE — Transfer of Care (Signed)
Immediate Anesthesia Transfer of Care Note  Patient: Latasha [name]  Procedure(s) Performed: Procedure(s) (LRB): LEFT KNEE ARTHROSCOPY WITH PARTIAL MEDIAL  MENISECTOMY AND CHONDROPLASTY (Left)  Patient Location: PACU  Anesthesia Type: General  Level of Consciousness: awake, alert  and oriented  Airway & Oxygen Therapy: Patient Spontanous Breathing and Patient connected to face mask oxygen  Post-op Assessment: Report given to PACU RN and Post -op Vital signs reviewed and stable  Post vital signs: Reviewed and stable  Complications: No apparent anesthesia complications

## 2013-09-09 NOTE — Discharge Instructions (Signed)

## 2013-09-09 NOTE — H&P (View-Only) (Signed)
NPO AFTER MN WITH EXCEPTION CLEAR LIQUIDS UNTIL 0700 (NO CREAM/ MILK PRODUCTS).  ARRIVE AT 1115.  CURRENT LAB RESULTS IN EPIC AND CHART.   WILL TAKE FLEXERIL AND TRAMADOL AM DOS W/ SIPS OF WATER.

## 2013-09-09 NOTE — Interval H&P Note (Signed)
History and Physical Interval Note:  09/09/2013 7:53 AM  Latasha [name]  has presented today for surgery, with the diagnosis of left knee medial and lateral meniscus tear and oa  The various methods of treatment have been discussed with the patient and family. After consideration of risks, benefits and other options for treatment, the patient has consented to  Procedure(s): LEFT KNEE ARTHROSCOPY WITH PARTIAL MEDIAL AND LATERAL MENISECTOMY AND CHONDROPLASTY (Left) as a surgical intervention .  The patient's history has been reviewed, patient examined, no change in status, stable for surgery.  I have reviewed the patient's chart and labs.  Questions were answered to the patient's satisfaction.     Latasha [name] ANDREW

## 2013-09-09 NOTE — Anesthesia Preprocedure Evaluation (Addendum)
Anesthesia Evaluation  Patient identified by MRN, date of birth, ID band Patient awake    Reviewed: Allergy & Precautions, H&P , NPO status , Patient's Chart, lab work & pertinent test results  Airway Mallampati: II TM Distance: >3 FB Neck ROM: full    Dental no notable dental hx. (+) Caps, Dental Advisory Given Left upper front capped:   Pulmonary neg pulmonary ROS,  breath sounds clear to auscultation  Pulmonary exam normal       Cardiovascular Exercise Tolerance: Good negative cardio ROS  Rhythm:regular Rate:Normal     Neuro/Psych negative neurological ROS  negative psych ROS   GI/Hepatic negative GI ROS, Neg liver ROS,   Endo/Other  negative endocrine ROSMorbid obesity  Renal/GU negative Renal ROS  negative genitourinary   Musculoskeletal   Abdominal (+) + obese,   Peds  Hematology negative hematology ROS (+)   Anesthesia Other Findings   Reproductive/Obstetrics negative OB ROS                          Anesthesia Physical Anesthesia Plan  ASA: III  Anesthesia Plan: MAC   Post-op Pain Management:    Induction:   Airway Management Planned: Nasal Cannula  Additional Equipment:   Intra-op Plan:   Post-operative Plan:   Informed Consent: I have reviewed the patients History and Physical, chart, labs and discussed the procedure including the risks, benefits and alternatives for the proposed anesthesia with the patient or authorized representative who has indicated his/her understanding and acceptance.   Dental Advisory Given  Plan Discussed with: CRNA and Surgeon  Anesthesia Plan Comments:        Anesthesia Quick Evaluation

## 2013-09-10 ENCOUNTER — Encounter (HOSPITAL_BASED_OUTPATIENT_CLINIC_OR_DEPARTMENT_OTHER): Payer: Self-pay | Admitting: Specialist

## 2013-09-15 ENCOUNTER — Other Ambulatory Visit (HOSPITAL_COMMUNITY): Payer: Self-pay | Admitting: *Deleted

## 2013-09-15 ENCOUNTER — Ambulatory Visit (HOSPITAL_COMMUNITY)
Admission: RE | Admit: 2013-09-15 | Discharge: 2013-09-15 | Disposition: A | Discharge status: Home / Self Care | Payer: 59 | Source: Ambulatory Visit | Attending: Cardiology | Admitting: Cardiology

## 2013-09-15 DIAGNOSIS — M7989 Other specified soft tissue disorders: Principal | ICD-10-CM

## 2013-09-15 DIAGNOSIS — M79606 Pain in leg, unspecified: Secondary | ICD-10-CM

## 2013-09-15 DIAGNOSIS — R609 Edema, unspecified: Secondary | ICD-10-CM | POA: Insufficient documentation

## 2013-09-15 DIAGNOSIS — M79609 Pain in unspecified limb: Secondary | ICD-10-CM | POA: Insufficient documentation

## 2013-09-15 NOTE — Progress Notes (Signed)
Left Lower Ext. Venous Duplex Completed. Negative for DVT. Ayren Zumbro, BS, RDMS, RVT  

## 2013-09-19 ENCOUNTER — Telehealth (HOSPITAL_COMMUNITY): Payer: Self-pay | Admitting: *Deleted

## 2014-07-06 ENCOUNTER — Other Ambulatory Visit (HOSPITAL_COMMUNITY): Payer: Self-pay | Admitting: Orthopedic Surgery

## 2014-07-06 DIAGNOSIS — M25561 Pain in right knee: Secondary | ICD-10-CM

## 2014-07-08 ENCOUNTER — Ambulatory Visit (HOSPITAL_BASED_OUTPATIENT_CLINIC_OR_DEPARTMENT_OTHER)
Admission: RE | Admit: 2014-07-08 | Discharge: 2014-07-08 | Disposition: A | Discharge status: Home / Self Care | Payer: 59 | Source: Ambulatory Visit | Attending: Orthopedic Surgery | Admitting: Orthopedic Surgery

## 2014-07-08 DIAGNOSIS — S83241A Other tear of medial meniscus, current injury, right knee, initial encounter: Secondary | ICD-10-CM | POA: Insufficient documentation

## 2014-07-08 DIAGNOSIS — M948X6 Other specified disorders of cartilage, lower leg: Secondary | ICD-10-CM | POA: Diagnosis present

## 2014-07-08 DIAGNOSIS — X58XXXA Exposure to other specified factors, initial encounter: Secondary | ICD-10-CM | POA: Insufficient documentation

## 2014-07-08 DIAGNOSIS — M2241 Chondromalacia patellae, right knee: Secondary | ICD-10-CM | POA: Diagnosis not present

## 2014-07-08 DIAGNOSIS — M25561 Pain in right knee: Secondary | ICD-10-CM

## 2014-07-16 ENCOUNTER — Ambulatory Visit (HOSPITAL_COMMUNITY): Payer: 59

## 2014-07-31 ENCOUNTER — Encounter (HOSPITAL_BASED_OUTPATIENT_CLINIC_OR_DEPARTMENT_OTHER): Payer: Self-pay | Admitting: *Deleted

## 2014-08-03 ENCOUNTER — Other Ambulatory Visit: Payer: Self-pay | Admitting: Orthopedic Surgery

## 2014-08-03 ENCOUNTER — Encounter (HOSPITAL_BASED_OUTPATIENT_CLINIC_OR_DEPARTMENT_OTHER): Payer: Self-pay | Admitting: *Deleted

## 2014-08-03 NOTE — Progress Notes (Signed)
NPO AFTER MN WITH EXCEPTION CLEAR LIQUIDS UNTIL 0700 (NO CREAM/ MILK PRODUCTS).  ARRIVE AT 1115. NEEDS HG AND URINE PREG. MAY TAKE HYDROCODONE AM DOS W/ SIPS OF WATER.

## 2014-08-04 ENCOUNTER — Encounter (HOSPITAL_BASED_OUTPATIENT_CLINIC_OR_DEPARTMENT_OTHER): Payer: Self-pay | Admitting: Anesthesiology

## 2014-08-04 ENCOUNTER — Ambulatory Visit (HOSPITAL_BASED_OUTPATIENT_CLINIC_OR_DEPARTMENT_OTHER): Payer: 59 | Admitting: Anesthesiology

## 2014-08-04 ENCOUNTER — Ambulatory Visit (HOSPITAL_BASED_OUTPATIENT_CLINIC_OR_DEPARTMENT_OTHER)
Admission: RE | Admit: 2014-08-04 | Discharge: 2014-08-04 | Disposition: A | Discharge status: Home / Self Care | Payer: 59 | Source: Ambulatory Visit | Attending: Specialist | Admitting: Specialist

## 2014-08-04 ENCOUNTER — Encounter (HOSPITAL_BASED_OUTPATIENT_CLINIC_OR_DEPARTMENT_OTHER)
Admission: RE | Disposition: A | Discharge status: Home / Self Care | Payer: Self-pay | Source: Ambulatory Visit | Attending: Specialist

## 2014-08-04 DIAGNOSIS — X58XXXA Exposure to other specified factors, initial encounter: Secondary | ICD-10-CM | POA: Insufficient documentation

## 2014-08-04 DIAGNOSIS — G894 Chronic pain syndrome: Secondary | ICD-10-CM | POA: Diagnosis not present

## 2014-08-04 DIAGNOSIS — M179 Osteoarthritis of knee, unspecified: Secondary | ICD-10-CM | POA: Insufficient documentation

## 2014-08-04 DIAGNOSIS — Y939 Activity, unspecified: Secondary | ICD-10-CM | POA: Diagnosis not present

## 2014-08-04 DIAGNOSIS — M94261 Chondromalacia, right knee: Secondary | ICD-10-CM | POA: Insufficient documentation

## 2014-08-04 DIAGNOSIS — Z79891 Long term (current) use of opiate analgesic: Secondary | ICD-10-CM | POA: Insufficient documentation

## 2014-08-04 DIAGNOSIS — Y929 Unspecified place or not applicable: Secondary | ICD-10-CM | POA: Diagnosis not present

## 2014-08-04 DIAGNOSIS — Z8781 Personal history of (healed) traumatic fracture: Secondary | ICD-10-CM | POA: Diagnosis not present

## 2014-08-04 DIAGNOSIS — Z9889 Other specified postprocedural states: Secondary | ICD-10-CM | POA: Diagnosis not present

## 2014-08-04 DIAGNOSIS — S83231A Complex tear of medial meniscus, current injury, right knee, initial encounter: Secondary | ICD-10-CM | POA: Insufficient documentation

## 2014-08-04 DIAGNOSIS — Y999 Unspecified external cause status: Secondary | ICD-10-CM | POA: Diagnosis not present

## 2014-08-04 DIAGNOSIS — Z79899 Other long term (current) drug therapy: Secondary | ICD-10-CM | POA: Diagnosis not present

## 2014-08-04 DIAGNOSIS — M25561 Pain in right knee: Secondary | ICD-10-CM | POA: Diagnosis present

## 2014-08-04 DIAGNOSIS — Z6841 Body Mass Index (BMI) 40.0 and over, adult: Secondary | ICD-10-CM | POA: Diagnosis not present

## 2014-08-04 HISTORY — DX: Unspecified tear of unspecified meniscus, current injury, right knee, initial encounter: S83.206A

## 2014-08-04 HISTORY — PX: CHONDROPLASTY: SHX5177

## 2014-08-04 HISTORY — DX: Unspecified osteoarthritis, unspecified site: M19.90

## 2014-08-04 HISTORY — PX: KNEE ARTHROSCOPY WITH MEDIAL MENISECTOMY: SHX5651

## 2014-08-04 LAB — POCT HEMOGLOBIN-HEMACUE: HEMOGLOBIN: 9.9 g/dL — AB (ref 12.0–15.0)

## 2014-08-04 SURGERY — ARTHROSCOPY, KNEE, WITH MEDIAL MENISCECTOMY
Anesthesia: General | Site: Knee | Laterality: Right

## 2014-08-04 MED ORDER — LIDOCAINE HCL (CARDIAC) 20 MG/ML IV SOLN
INTRAVENOUS | Status: DC | PRN
  Administered 2014-08-04: 80 mg via INTRAVENOUS

## 2014-08-04 MED ORDER — POVIDONE-IODINE 7.5 % EX SOLN
Freq: Once | CUTANEOUS | Status: DC
  Filled 2014-08-04: qty 118

## 2014-08-04 MED ORDER — ACETAMINOPHEN 10 MG/ML IV SOLN
INTRAVENOUS | Status: DC | PRN
  Administered 2014-08-04: 1000 mg via INTRAVENOUS

## 2014-08-04 MED ORDER — CEFAZOLIN SODIUM-DEXTROSE 2-3 GM-% IV SOLR
2.0000 g | INTRAVENOUS | Status: AC
  Administered 2014-08-04: 2 g via INTRAVENOUS
  Filled 2014-08-04: qty 50

## 2014-08-04 MED ORDER — PROPOFOL 10 MG/ML IV BOLUS
INTRAVENOUS | Status: DC | PRN
  Administered 2014-08-04: 200 mg via INTRAVENOUS

## 2014-08-04 MED ORDER — HYDROMORPHONE HCL 1 MG/ML IJ SOLN
INTRAMUSCULAR | Status: AC
  Filled 2014-08-04: qty 1

## 2014-08-04 MED ORDER — KETAMINE HCL 10 MG/ML IJ SOLN
INTRAMUSCULAR | Status: DC | PRN
  Administered 2014-08-04: 25 mg via INTRAVENOUS
  Administered 2014-08-04: 20 mg via INTRAVENOUS

## 2014-08-04 MED ORDER — FENTANYL CITRATE 0.05 MG/ML IJ SOLN
INTRAMUSCULAR | Status: AC
  Filled 2014-08-04: qty 2

## 2014-08-04 MED ORDER — SODIUM CHLORIDE 0.9 % IR SOLN
Status: DC | PRN
  Administered 2014-08-04: 9000 mL

## 2014-08-04 MED ORDER — CEPHALEXIN 500 MG PO CAPS: 500.0000 mg | ORAL_CAPSULE | Freq: Three times a day (TID) | ORAL | Status: DC

## 2014-08-04 MED ORDER — MIDAZOLAM HCL 2 MG/2ML IJ SOLN
INTRAMUSCULAR | Status: AC
  Filled 2014-08-04: qty 2

## 2014-08-04 MED ORDER — ASPIRIN EC 325 MG PO TBEC: 325.0000 mg | DELAYED_RELEASE_TABLET | Freq: Two times a day (BID) | ORAL | Status: DC

## 2014-08-04 MED ORDER — CEFAZOLIN SODIUM-DEXTROSE 2-3 GM-% IV SOLR
INTRAVENOUS | Status: AC
  Filled 2014-08-04: qty 50

## 2014-08-04 MED ORDER — DEXAMETHASONE SODIUM PHOSPHATE 4 MG/ML IJ SOLN
INTRAMUSCULAR | Status: DC | PRN
  Administered 2014-08-04: 10 mg via INTRAVENOUS

## 2014-08-04 MED ORDER — FENTANYL CITRATE 0.05 MG/ML IJ SOLN
25.0000 ug | INTRAMUSCULAR | Status: DC | PRN
  Administered 2014-08-04 (×2): 25 ug via INTRAVENOUS
  Filled 2014-08-04: qty 1

## 2014-08-04 MED ORDER — BUPIVACAINE HCL 0.25 % IJ SOLN
INTRAMUSCULAR | Status: DC | PRN
  Administered 2014-08-04: 30 mL

## 2014-08-04 MED ORDER — MIDAZOLAM HCL 5 MG/5ML IJ SOLN
INTRAMUSCULAR | Status: DC | PRN
  Administered 2014-08-04: 2 mg via INTRAVENOUS

## 2014-08-04 MED ORDER — FENTANYL CITRATE 0.05 MG/ML IJ SOLN
INTRAMUSCULAR | Status: DC | PRN
  Administered 2014-08-04 (×2): 50 ug via INTRAVENOUS

## 2014-08-04 MED ORDER — KETAMINE HCL 10 MG/ML IJ SOLN
INTRAMUSCULAR | Status: AC
  Filled 2014-08-04: qty 1

## 2014-08-04 MED ORDER — OXYCODONE-ACETAMINOPHEN 5-325 MG PO TABS: 1.0000 | ORAL_TABLET | Freq: Four times a day (QID) | ORAL | Status: DC | PRN

## 2014-08-04 MED ORDER — ONDANSETRON HCL 4 MG/2ML IJ SOLN
INTRAMUSCULAR | Status: DC | PRN
  Administered 2014-08-04: 4 mg via INTRAVENOUS

## 2014-08-04 MED ORDER — MORPHINE SULFATE 4 MG/ML IJ SOLN
INTRAMUSCULAR | Status: DC | PRN
  Administered 2014-08-04: 4 mg via INTRAVENOUS

## 2014-08-04 MED ORDER — MORPHINE SULFATE 4 MG/ML IJ SOLN
INTRAMUSCULAR | Status: AC
  Filled 2014-08-04: qty 1

## 2014-08-04 MED ORDER — LACTATED RINGERS IV SOLN
INTRAVENOUS | Status: DC
  Administered 2014-08-04 (×2): via INTRAVENOUS
  Filled 2014-08-04: qty 1000

## 2014-08-04 MED ORDER — PROMETHAZINE HCL 25 MG/ML IJ SOLN
6.2500 mg | INTRAMUSCULAR | Status: DC | PRN
  Filled 2014-08-04: qty 1

## 2014-08-04 SURGICAL SUPPLY — 55 items
BANDAGE ESMARK 6X9 LF (GAUZE/BANDAGES/DRESSINGS) ×1 IMPLANT
BLADE 4.2CUDA (BLADE) IMPLANT
BLADE CUDA 4.2 (BLADE) IMPLANT
BLADE CUDA GRT WHITE 3.5 (BLADE) ×2 IMPLANT
BLADE CUDA SHAVER 3.5 (BLADE) IMPLANT
BNDG ESMARK 6X9 LF (GAUZE/BANDAGES/DRESSINGS) ×2
BNDG GAUZE ELAST 4 BULKY (GAUZE/BANDAGES/DRESSINGS) ×2 IMPLANT
CANISTER SUCT LVC 12 LTR MEDI- (MISCELLANEOUS) ×2 IMPLANT
CANISTER SUCTION 1200CC (MISCELLANEOUS) ×2 IMPLANT
CLOTH BEACON ORANGE TIMEOUT ST (SAFETY) ×2 IMPLANT
CUFF TOURNIQUET SINGLE 34IN LL (TOURNIQUET CUFF) ×2 IMPLANT
DRAPE ARTHROSCOPY W/POUCH 114 (DRAPES) ×2 IMPLANT
DRAPE INCISE 23X17 IOBAN STRL (DRAPES) ×1
DRAPE INCISE IOBAN 23X17 STRL (DRAPES) ×1 IMPLANT
DRAPE INCISE IOBAN 66X45 STRL (DRAPES) ×2 IMPLANT
DRSG PAD ABDOMINAL 8X10 ST (GAUZE/BANDAGES/DRESSINGS) ×2 IMPLANT
DURAPREP 26ML APPLICATOR (WOUND CARE) ×2 IMPLANT
ELECT MENISCUS 165MM 90D (ELECTRODE) IMPLANT
ELECT REM PT RETURN 9FT ADLT (ELECTROSURGICAL)
ELECTRODE REM PT RTRN 9FT ADLT (ELECTROSURGICAL) IMPLANT
GAUZE XEROFORM 1X8 LF (GAUZE/BANDAGES/DRESSINGS) ×2 IMPLANT
GLOVE BIO SURGEON STRL SZ7.5 (GLOVE) ×2 IMPLANT
GLOVE BIO SURGEON STRL SZ8 (GLOVE) ×4 IMPLANT
GLOVE BIOGEL PI IND STRL 7.5 (GLOVE) ×1 IMPLANT
GLOVE BIOGEL PI INDICATOR 7.5 (GLOVE) ×1
GLOVE INDICATOR 8.0 STRL GRN (GLOVE) ×4 IMPLANT
GLOVE SURG SS PI 7.5 STRL IVOR (GLOVE) ×4 IMPLANT
GOWN PREVENTION PLUS LG XLONG (DISPOSABLE) IMPLANT
GOWN STRL REIN XL XLG (GOWN DISPOSABLE) IMPLANT
GOWN STRL REUS W/TWL LRG LVL3 (GOWN DISPOSABLE) ×2 IMPLANT
GOWN STRL REUS W/TWL XL LVL3 (GOWN DISPOSABLE) ×2 IMPLANT
IMMOBILIZER KNEE 22 UNIV (SOFTGOODS) IMPLANT
IMMOBILIZER KNEE 24 THIGH 36 (MISCELLANEOUS) IMPLANT
IMMOBILIZER KNEE 24 UNIV (MISCELLANEOUS)
IV NS IRRIG 3000ML ARTHROMATIC (IV SOLUTION) ×6 IMPLANT
KNEE WRAP E Z 3 GEL PACK (MISCELLANEOUS) ×2 IMPLANT
MINI VAC (SURGICAL WAND) ×2 IMPLANT
NEEDLE HYPO 22GX1.5 SAFETY (NEEDLE) ×2 IMPLANT
PACK ARTHROSCOPY DSU (CUSTOM PROCEDURE TRAY) ×2 IMPLANT
PACK BASIN DAY SURGERY FS (CUSTOM PROCEDURE TRAY) ×2 IMPLANT
PAD ABD 8X10 STRL (GAUZE/BANDAGES/DRESSINGS) ×4 IMPLANT
PADDING CAST ABS 4INX4YD NS (CAST SUPPLIES) ×1
PADDING CAST ABS COTTON 4X4 ST (CAST SUPPLIES) ×1 IMPLANT
PENCIL BUTTON HOLSTER BLD 10FT (ELECTRODE) IMPLANT
SET ARTHROSCOPY TUBING (MISCELLANEOUS) ×1
SET ARTHROSCOPY TUBING LN (MISCELLANEOUS) ×1 IMPLANT
SPONGE GAUZE 4X4 12PLY (GAUZE/BANDAGES/DRESSINGS) ×2 IMPLANT
SPONGE GAUZE 4X4 12PLY STER LF (GAUZE/BANDAGES/DRESSINGS) ×2 IMPLANT
SUT ETHILON 4 0 PS 2 18 (SUTURE) ×2 IMPLANT
SYR CONTROL 10ML LL (SYRINGE) ×2 IMPLANT
TOWEL OR 17X24 6PK STRL BLUE (TOWEL DISPOSABLE) ×2 IMPLANT
TUBE CONNECTING 12X1/4 (SUCTIONS) ×2 IMPLANT
WAND 30 DEG SABER W/CORD (SURGICAL WAND) IMPLANT
WAND 90 DEG TURBOVAC W/CORD (SURGICAL WAND) IMPLANT
WATER STERILE IRR 500ML POUR (IV SOLUTION) ×2 IMPLANT

## 2014-08-04 NOTE — Anesthesia Postprocedure Evaluation (Signed)
  Anesthesia Post-op Note  Patient: Latasha [name]  Procedure(s) Performed: Procedure(s): RIGHT KNEE ARTHROSCOPY WITH  PARTIAL MEDIAL MENISECTOMY, (Right) CHONDROPLASTY (Right)  Patient Location: PACU  Anesthesia Type:General  Level of Consciousness: awake, alert , oriented and patient cooperative  Airway and Oxygen Therapy: Patient Spontanous Breathing  Post-op Pain: mild  Post-op Assessment: Post-op Vital signs reviewed, Patient's Cardiovascular Status Stable, Respiratory Function Stable, Patent Airway, No signs of Nausea or vomiting and Pain level controlled  Post-op Vital Signs: stable  Last Vitals:  Filed Vitals:   08/04/14 1526  BP:   Pulse: 63  Temp:   Resp: 10    Complications: No apparent anesthesia complications

## 2014-08-04 NOTE — Discharge Instructions (Signed)

## 2014-08-04 NOTE — Transfer of Care (Signed)
Immediate Anesthesia Transfer of Care Note  Patient: Latasha [name]  Procedure(s) Performed: Procedure(s): RIGHT KNEE ARTHROSCOPY WITH  PARTIAL MEDIAL MENISECTOMY, (Right) CHONDROPLASTY (Right)  Patient Location: PACU  Anesthesia Type:General  Level of Consciousness: awake, alert , oriented and patient cooperative  Airway & Oxygen Therapy: Patient Spontanous Breathing and Patient connected to nasal cannula oxygen  Post-op Assessment: Report given to RN and Post -op Vital signs reviewed and stable  Post vital signs: Reviewed and stable  Last Vitals:  Filed Vitals:   08/04/14 1138  BP: 119/82  Pulse: 75  Temp: 36.9 C  Resp: 16    Complications: No apparent anesthesia complications

## 2014-08-04 NOTE — H&P (View-Only) (Signed)
NPO AFTER MN WITH EXCEPTION CLEAR LIQUIDS UNTIL 0700 (NO CREAM/ MILK PRODUCTS).  ARRIVE AT 1115. NEEDS HG AND URINE PREG. MAY TAKE HYDROCODONE AM DOS W/ SIPS OF WATER.
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
## 2014-08-04 NOTE — Anesthesia Procedure Notes (Signed)
Procedure Name: LMA Insertion Date/Time: 08/04/2014 1:28 PM Performed by: Wanita Chamberlain Pre-anesthesia Checklist: Patient identified, Timeout performed, Emergency Drugs available, Suction available and Patient being monitored Patient Re-evaluated:Patient Re-evaluated prior to inductionOxygen Delivery Method: Circle system utilized Preoxygenation: Pre-oxygenation with 100% oxygen Intubation Type: Rapid sequence and IV induction LMA: LMA inserted LMA Size: 4.0 Number of attempts: 1 Airway Equipment and Method: Bite block Placement Confirmation: positive ETCO2 and breath sounds checked- equal and bilateral Tube secured with: Tape Dental Injury: Teeth and Oropharynx as per pre-operative assessment

## 2014-08-04 NOTE — Interval H&P Note (Signed)
History and Physical Interval Note:  08/04/2014 1:17 PM  Latasha [name]  has presented today for surgery, with the diagnosis of right knee osteoarthitis and torn meniscus  The various methods of treatment have been discussed with the patient and family. After consideration of risks, benefits and other options for treatment, the patient has consented to  Procedure(s): RIGHT KNEE ARTHROSCOPY WITH MEDIAL MENISECTOMY, DEBRIDEMENT AND CHONDROPLASTY (Right) as a surgical intervention .  The patient's history has been reviewed, patient examined, no change in status, stable for surgery.  I have reviewed the patient's chart and labs.  Questions were answered to the patient's satisfaction.     Khole Arterburn ANDREW

## 2014-08-04 NOTE — Op Note (Signed)
Dictated# [id number]

## 2014-08-05 ENCOUNTER — Encounter (HOSPITAL_BASED_OUTPATIENT_CLINIC_OR_DEPARTMENT_OTHER): Payer: Self-pay | Admitting: Specialist

## 2014-08-05 NOTE — Op Note (Signed)
[name], [name]          ACCOUNT NO.:  [id number]  MEDICAL RECORD NO.:  [id number]  LOCATION:                                 FACILITY:  PHYSICIAN:  Cynda Familia, M.D.DATE OF BIRTH:  11/17/1965  DATE OF PROCEDURE:  08/04/2014 DATE OF DISCHARGE:  08/04/2014                              OPERATIVE REPORT   PREOPERATIVE DIAGNOSES:  Right knee torn medial meniscus, osteoarthritis.  POSTOPERATIVE DIAGNOSES: 1. Right knee complex tear, posterior horn, medial meniscus. 2. Osteoarthritis, grade 3 and 4 chondromalacia, medial femoral     condyle.  PROCEDURES: 1. Right knee arthroscopic partial medial meniscectomy. 2. Chondroplasty of medial femoral condyle.  SURGEON:  Cynda Familia, M.D.  ASSISTANT:  Wyatt Portela, PA-C.  ANESTHESIA:  General with intraoperative knee block.  ESTIMATED BLOOD LOSS:  Minimal.  DRAINS:  None.  COMPLICATIONS:  None.  TOURNIQUET TIME:  30 minutes at 300 mmHg.  DISPOSITION:  To PACU, stable.  OPERATIVE DETAILS:  The patient was counseled in the holding area. Correct site was marked and signed appropriately.  On the way to the operating room, IV Ancef was given.  In the OR, placed in supine position and given general anesthesia.  All extremities were well padded.  Right thigh was placed in a thigh holder, and prepped with DuraPrep and draped in a sterile fashion.  c was done, confirmed the right side.  We also were great cautious with her leg and ankle, where she had trauma there.  Arthroscopic portals were established, proximal medial, inferomedial, and inferolateral.  Diagnostic arthroscopy, suprapatellar pouch, medial and lateral gutters were unremarkable. Patellofemoral joint, normal tracking and minimal chondromalacia.  ACL and PCL were intact.  Lateral side inspected.  Minimal chondromalacia looked excellent as did the lateral meniscus.  Medial side was inspected.  There was grade 3 and 4 chondromalacia of the  medial femoral condyle.  No stable flap, so chondroplasty was performed with mechanical shaver back to stable base.  There was a complex tear of the posterior half of the medial meniscus, utilizing some baskets, motorized shaver, and mini VAC system.  A partial medial meniscectomy was performed back to stable base.  There were no other abnormalities noted.  Knee was irrigated and arthroscopic equipments were removed.  A 10 mL of Sensorcaine was placed to the skin, and 10 mL of Sensorcaine and 4 mg of morphine sulfate were injected in the knee joint.  Sterile dressing was applied followed by TED hose.  Tourniquet was deflated after application of the dressing.  Normal circulation to the foot and ankle.  She was then awakened and taken from the operating room to PACU in stable condition.  She will be stabilized in PACU and discharged to home.          ______________________________ Cynda Familia, M.D.     RAC/MEDQ  D:  08/04/2014  T:  08/05/2014  Job:  [id number]

## 2014-11-25 ENCOUNTER — Other Ambulatory Visit: Payer: Self-pay | Admitting: Family Medicine

## 2014-11-25 DIAGNOSIS — Z1231 Encounter for screening mammogram for malignant neoplasm of breast: Secondary | ICD-10-CM

## 2014-12-09 ENCOUNTER — Other Ambulatory Visit (HOSPITAL_COMMUNITY)
Admission: RE | Admit: 2014-12-09 | Discharge: 2014-12-09 | Disposition: A | Discharge status: Home / Self Care | Payer: 59 | Source: Ambulatory Visit | Attending: Obstetrics and Gynecology | Admitting: Obstetrics and Gynecology

## 2014-12-09 ENCOUNTER — Other Ambulatory Visit: Payer: Self-pay | Admitting: Obstetrics and Gynecology

## 2014-12-09 DIAGNOSIS — Z1151 Encounter for screening for human papillomavirus (HPV): Secondary | ICD-10-CM | POA: Insufficient documentation

## 2014-12-09 DIAGNOSIS — Z01411 Encounter for gynecological examination (general) (routine) with abnormal findings: Secondary | ICD-10-CM | POA: Diagnosis present

## 2014-12-14 LAB — CYTOLOGY - PAP

## 2014-12-28 ENCOUNTER — Other Ambulatory Visit: Payer: Self-pay | Admitting: Obstetrics and Gynecology

## 2014-12-30 ENCOUNTER — Other Ambulatory Visit: Payer: Self-pay | Admitting: Obstetrics and Gynecology

## 2014-12-30 DIAGNOSIS — Z1231 Encounter for screening mammogram for malignant neoplasm of breast: Secondary | ICD-10-CM

## 2015-01-01 ENCOUNTER — Ambulatory Visit
Admission: RE | Admit: 2015-01-01 | Discharge: 2015-01-01 | Disposition: A | Discharge status: Home / Self Care | Payer: 59 | Source: Ambulatory Visit | Attending: Family Medicine | Admitting: Family Medicine

## 2015-01-01 DIAGNOSIS — Z1231 Encounter for screening mammogram for malignant neoplasm of breast: Secondary | ICD-10-CM

## 2015-01-03 ENCOUNTER — Ambulatory Visit (HOSPITAL_BASED_OUTPATIENT_CLINIC_OR_DEPARTMENT_OTHER): Payer: 59 | Attending: Family Medicine

## 2015-01-03 VITALS — Ht 62.0 in | Wt 226.0 lb

## 2015-01-03 DIAGNOSIS — G4733 Obstructive sleep apnea (adult) (pediatric): Secondary | ICD-10-CM | POA: Diagnosis present

## 2015-01-09 DIAGNOSIS — G4733 Obstructive sleep apnea (adult) (pediatric): Secondary | ICD-10-CM | POA: Diagnosis not present

## 2015-01-09 NOTE — Progress Notes (Signed)
  Patient Name: Latasha, [name] Date: 01/03/2015 Gender: Female D.O.B: Apr 02, 1966 Age (years): 48 Referring Provider: Cammie Fulp Height (inches): 62 Interpreting Physician: Baird Lyons MD, ABSM Weight (lbs): 226 RPSGT: Jonna Coup BMI: 41 MRN: [id number] Neck Size: 15.00 CLINICAL INFORMATION Sleep Study Type: NPSG  Indication for sleep study: N/A  Epworth Sleepiness Score:  SLEEP STUDY TECHNIQUE As per the AASM Manual for the Scoring of Sleep and Associated Events v2.3 (April 2016) with a hypopnea requiring 4% desaturations.  The channels recorded and monitored were frontal, central and occipital EEG, electrooculogram (EOG), submentalis EMG (chin), nasal and oral airflow, thoracic and abdominal wall motion, anterior tibialis EMG, snore microphone, electrocardiogram, and pulse oximetry.  MEDICATIONS Patient's medications include: Charted for review. Medications self-administered by patient during sleep study : No sleep medicine administered.  SLEEP ARCHITECTURE The study was initiated at 11:03:58 PM and ended at 5:07:47 AM.  Sleep onset time was 5.5 minutes and the sleep efficiency was 85.8%. The total sleep time was 312.3 minutes.  Stage REM latency was 170.0 minutes.  The patient spent 2.08% of the night in stage N1 sleep, 72.46% in stage N2 sleep, 8.17% in stage N3 and 17.29% in REM.  Alpha intrusion was absent.  Supine sleep was 14.25  Awake after sleep onset 46 minutes  RESPIRATORY PARAMETERS The overall apnea/hypopnea index (AHI) was 3.5 per hour. There were 5 total apneas, including 4 obstructive, 1 central and 0 mixed apneas. There were 13 hypopneas and 0 RERAs.  The AHI during Stage REM sleep was 12.2 per hour.  AHI while supine was 2.7 per hour.  The mean oxygen saturation was 94.58%. The minimum SpO2 during sleep was 83.00%.  Moderate snoring was noted during this study.  CARDIAC DATA The 2 lead EKG demonstrated sinus rhythm. The mean  heart rate was 64.31 beats per minute. Other EKG findings include: None.  LEG MOVEMENT DATA The total PLMS were 70 with a resulting PLMS index of 13.45. Associated arousal with leg movement index was 3.3 .  IMPRESSIONS No significant obstructive sleep apnea occurred during this study (AHI = 3.5/h). No significant central sleep apnea occurred during this study (CAI = 0.2/h). Mild oxygen desaturation was noted during this study (Min O2 = 83.00%). The patient snored with Moderate snoring volume. No cardiac abnormalities were noted during this study. Mild periodic limb movements of sleep occurred during the study. No significant associated arousals.   DIAGNOSIS Normal study  RECOMMENDATIONS Avoid alcohol, sedatives and other CNS depressants that may worsen sleep apnea and disrupt normal sleep architecture. Sleep hygiene should be reviewed to assess factors that may improve sleep quality. Weight management and regular exercise should be initiated or continued if appropriate.  Deneise Lever Diplomate, American Board of Sleep Medicine  ELECTRONICALLY SIGNED ON:  01/09/2015, 4:56 PM [place] PH: [phone]   FX: [phone] [place]

## 2015-02-18 ENCOUNTER — Ambulatory Visit (HOSPITAL_BASED_OUTPATIENT_CLINIC_OR_DEPARTMENT_OTHER): Payer: 59

## 2015-04-21 NOTE — Patient Instructions (Signed)
Your procedure is scheduled on:  Wednesday, Nov. 30, 2016  Enter through the [employer] [facility] at:  10:45 am  Pick up the phone at the desk and dial [phone].  Call this number if you have problems the morning of surgery: [phone].  Remember: Do NOT eat food:  After midnight Tuesday, Nov. 29, 2016 Do NOT drink clear liquids after:  8:00 AM day of surgery Take these medicines the morning of surgery with a SIP OF WATER:  None  Do NOT wear jewelry (body piercing), metal hair clips/bobby pins, make-up, or nail polish. Do NOT wear lotions, powders, or perfumes.  You may wear deoderant. Do NOT shave for 48 hours prior to surgery. Do NOT bring valuables to the hospital. Contacts, dentures, or bridgework may not be worn into surgery. Leave suitcase in car.  After surgery it may be brought to your room.  For patients admitted to the hospital, checkout time is 11:00 AM the day of discharge.

## 2015-04-22 ENCOUNTER — Encounter (HOSPITAL_COMMUNITY)
Admission: RE | Admit: 2015-04-22 | Discharge: 2015-04-22 | Disposition: A | Discharge status: Home / Self Care | Payer: Federal, State, Local not specified - PPO | Source: Ambulatory Visit | Attending: Obstetrics and Gynecology | Admitting: Obstetrics and Gynecology

## 2015-04-22 ENCOUNTER — Encounter (HOSPITAL_COMMUNITY): Payer: Self-pay

## 2015-04-22 DIAGNOSIS — N920 Excessive and frequent menstruation with regular cycle: Secondary | ICD-10-CM | POA: Insufficient documentation

## 2015-04-22 DIAGNOSIS — Z01818 Encounter for other preprocedural examination: Secondary | ICD-10-CM | POA: Diagnosis present

## 2015-04-22 DIAGNOSIS — D251 Intramural leiomyoma of uterus: Secondary | ICD-10-CM | POA: Insufficient documentation

## 2015-04-22 HISTORY — DX: Headache, unspecified: R51.9

## 2015-04-22 HISTORY — DX: Major depressive disorder, single episode, unspecified: F32.9

## 2015-04-22 HISTORY — DX: Headache: R51

## 2015-04-22 HISTORY — DX: Depression, unspecified: F32.A

## 2015-04-22 LAB — CBC
HCT: 30.8 % — ABNORMAL LOW (ref 36.0–46.0)
Hemoglobin: 10.2 g/dL — ABNORMAL LOW (ref 12.0–15.0)
MCH: 23.4 pg — ABNORMAL LOW (ref 26.0–34.0)
MCHC: 33.1 g/dL (ref 30.0–36.0)
MCV: 70.6 fL — ABNORMAL LOW (ref 78.0–100.0)
Platelets: 295 10*3/uL (ref 150–400)
RBC: 4.36 MIL/uL (ref 3.87–5.11)
RDW: 19.7 % — ABNORMAL HIGH (ref 11.5–15.5)
WBC: 8.3 10*3/uL (ref 4.0–10.5)

## 2015-04-22 LAB — ABO/RH: ABO/RH(D): O POS

## 2015-04-22 LAB — TYPE AND SCREEN
ABO/RH(D): O POS
ANTIBODY SCREEN: NEGATIVE
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
## 2015-05-05 ENCOUNTER — Encounter (HOSPITAL_COMMUNITY)
Admission: RE | Disposition: A | Discharge status: Home / Self Care | Payer: Self-pay | Source: Ambulatory Visit | Attending: Obstetrics and Gynecology

## 2015-05-05 ENCOUNTER — Encounter (HOSPITAL_COMMUNITY): Payer: Self-pay | Admitting: Anesthesiology

## 2015-05-05 ENCOUNTER — Inpatient Hospital Stay (HOSPITAL_COMMUNITY): Payer: Federal, State, Local not specified - PPO | Admitting: Anesthesiology

## 2015-05-05 ENCOUNTER — Inpatient Hospital Stay (HOSPITAL_COMMUNITY)
Admission: RE | Admit: 2015-05-05 | Discharge: 2015-05-07 | DRG: 743 | Disposition: A | Discharge status: Home / Self Care | Payer: Federal, State, Local not specified - PPO | Source: Ambulatory Visit | Attending: Obstetrics and Gynecology | Admitting: Obstetrics and Gynecology

## 2015-05-05 DIAGNOSIS — N946 Dysmenorrhea, unspecified: Secondary | ICD-10-CM | POA: Diagnosis present

## 2015-05-05 DIAGNOSIS — N920 Excessive and frequent menstruation with regular cycle: Secondary | ICD-10-CM | POA: Diagnosis present

## 2015-05-05 DIAGNOSIS — Z90710 Acquired absence of both cervix and uterus: Secondary | ICD-10-CM

## 2015-05-05 DIAGNOSIS — D250 Submucous leiomyoma of uterus: Secondary | ICD-10-CM | POA: Diagnosis present

## 2015-05-05 DIAGNOSIS — G8921 Chronic pain due to trauma: Secondary | ICD-10-CM | POA: Diagnosis present

## 2015-05-05 DIAGNOSIS — D251 Intramural leiomyoma of uterus: Secondary | ICD-10-CM | POA: Diagnosis present

## 2015-05-05 HISTORY — PX: ABDOMINAL HYSTERECTOMY: SHX81

## 2015-05-05 HISTORY — PX: BILATERAL SALPINGECTOMY: SHX5743

## 2015-05-05 LAB — PREGNANCY, URINE: PREG TEST UR: NEGATIVE

## 2015-05-05 SURGERY — HYSTERECTOMY, ABDOMINAL
Anesthesia: General | Site: Abdomen

## 2015-05-05 MED ORDER — PANTOPRAZOLE SODIUM 40 MG PO TBEC
40.0000 mg | DELAYED_RELEASE_TABLET | Freq: Every day | ORAL | Status: DC
  Administered 2015-05-06 – 2015-05-07 (×2): 40 mg via ORAL
  Filled 2015-05-05 (×2): qty 1

## 2015-05-05 MED ORDER — MIDAZOLAM HCL 2 MG/2ML IJ SOLN
INTRAMUSCULAR | Status: DC | PRN
  Administered 2015-05-05: 2 mg via INTRAVENOUS

## 2015-05-05 MED ORDER — DIPHENHYDRAMINE HCL 50 MG/ML IJ SOLN: 12.5000 mg | Freq: Four times a day (QID) | INTRAMUSCULAR | Status: DC | PRN

## 2015-05-05 MED ORDER — LIDOCAINE HCL (PF) 1 % IJ SOLN
INTRAMUSCULAR | Status: AC
  Filled 2015-05-05: qty 5

## 2015-05-05 MED ORDER — SODIUM CHLORIDE 0.9 % IJ SOLN
9.0000 mL | INTRAMUSCULAR | Status: DC | PRN
Start: 2015-05-05 — End: 2015-05-06

## 2015-05-05 MED ORDER — PROPOFOL 10 MG/ML IV BOLUS
INTRAVENOUS | Status: DC | PRN
  Administered 2015-05-05: 200 mg via INTRAVENOUS

## 2015-05-05 MED ORDER — ONDANSETRON HCL 4 MG/2ML IJ SOLN
INTRAMUSCULAR | Status: DC | PRN
  Administered 2015-05-05: 4 mg via INTRAVENOUS

## 2015-05-05 MED ORDER — ONDANSETRON HCL 4 MG/2ML IJ SOLN: 4.0000 mg | Freq: Four times a day (QID) | INTRAMUSCULAR | Status: DC | PRN

## 2015-05-05 MED ORDER — DEXAMETHASONE SODIUM PHOSPHATE 4 MG/ML IJ SOLN
INTRAMUSCULAR | Status: AC
  Filled 2015-05-05: qty 1

## 2015-05-05 MED ORDER — MICROFIBRILLAR COLL HEMOSTAT EX POWD
CUTANEOUS | Status: AC
  Filled 2015-05-05: qty 5

## 2015-05-05 MED ORDER — SCOPOLAMINE 1 MG/3DAYS TD PT72
MEDICATED_PATCH | TRANSDERMAL | Status: AC
  Administered 2015-05-05: 1.5 mg via TRANSDERMAL
  Filled 2015-05-05: qty 1

## 2015-05-05 MED ORDER — KETOROLAC TROMETHAMINE 30 MG/ML IJ SOLN
INTRAMUSCULAR | Status: AC
  Filled 2015-05-05: qty 1

## 2015-05-05 MED ORDER — ROCURONIUM BROMIDE 100 MG/10ML IV SOLN
INTRAVENOUS | Status: DC | PRN
  Administered 2015-05-05 (×2): 10 mg via INTRAVENOUS
  Administered 2015-05-05: 40 mg via INTRAVENOUS
  Administered 2015-05-05: 5 mg via INTRAVENOUS

## 2015-05-05 MED ORDER — FENTANYL CITRATE (PF) 100 MCG/2ML IJ SOLN
INTRAMUSCULAR | Status: DC | PRN
  Administered 2015-05-05 (×4): 50 ug via INTRAVENOUS
  Administered 2015-05-05 (×2): 25 ug via INTRAVENOUS
  Administered 2015-05-05 (×2): 50 ug via INTRAVENOUS

## 2015-05-05 MED ORDER — LIDOCAINE HCL (CARDIAC) 20 MG/ML IV SOLN
INTRAVENOUS | Status: DC | PRN
  Administered 2015-05-05: 50 mg via INTRAVENOUS

## 2015-05-05 MED ORDER — SUCCINYLCHOLINE CHLORIDE 20 MG/ML IJ SOLN
INTRAMUSCULAR | Status: AC
  Filled 2015-05-05: qty 1

## 2015-05-05 MED ORDER — NEOSTIGMINE METHYLSULFATE 10 MG/10ML IV SOLN
INTRAVENOUS | Status: DC | PRN
  Administered 2015-05-05: 4 mg via INTRAVENOUS

## 2015-05-05 MED ORDER — SODIUM CHLORIDE 0.9 % IJ SOLN
INTRAMUSCULAR | Status: AC
  Filled 2015-05-05: qty 100

## 2015-05-05 MED ORDER — DEXAMETHASONE SODIUM PHOSPHATE 10 MG/ML IJ SOLN
INTRAMUSCULAR | Status: DC | PRN
  Administered 2015-05-05: 4 mg via INTRAVENOUS

## 2015-05-05 MED ORDER — ACETAMINOPHEN 10 MG/ML IV SOLN
1000.0000 mg | Freq: Once | INTRAVENOUS | Status: AC
  Administered 2015-05-05: 1000 mg via INTRAVENOUS
  Filled 2015-05-05: qty 100

## 2015-05-05 MED ORDER — HYDROMORPHONE 1 MG/ML IV SOLN
INTRAVENOUS | Status: DC
  Administered 2015-05-05: 3 mL via INTRAVENOUS
  Administered 2015-05-05: 19:00:00 via INTRAVENOUS
  Administered 2015-05-06: 1.2 mg via INTRAVENOUS
  Administered 2015-05-06: 0.9 mg via INTRAVENOUS
  Administered 2015-05-06: 1.8 mg via INTRAVENOUS
  Filled 2015-05-05: qty 25

## 2015-05-05 MED ORDER — NEOSTIGMINE METHYLSULFATE 10 MG/10ML IV SOLN
INTRAVENOUS | Status: AC
Start: 2015-05-05 — End: 2015-05-05
  Filled 2015-05-05: qty 1

## 2015-05-05 MED ORDER — LACTATED RINGERS IV SOLN
INTRAVENOUS | Status: DC
  Administered 2015-05-05 (×4): via INTRAVENOUS

## 2015-05-05 MED ORDER — CEFAZOLIN SODIUM-DEXTROSE 2-3 GM-% IV SOLR
2.0000 g | INTRAVENOUS | Status: AC
  Administered 2015-05-05: 2 g via INTRAVENOUS

## 2015-05-05 MED ORDER — ONDANSETRON HCL 4 MG/2ML IJ SOLN
INTRAMUSCULAR | Status: AC
  Filled 2015-05-05: qty 2

## 2015-05-05 MED ORDER — PROPOFOL 10 MG/ML IV BOLUS
INTRAVENOUS | Status: AC
  Filled 2015-05-05: qty 20

## 2015-05-05 MED ORDER — SIMETHICONE 80 MG PO CHEW
80.0000 mg | CHEWABLE_TABLET | Freq: Four times a day (QID) | ORAL | Status: DC | PRN
  Administered 2015-05-07: 80 mg via ORAL
  Filled 2015-05-05: qty 1

## 2015-05-05 MED ORDER — GLYCOPYRROLATE 0.2 MG/ML IJ SOLN
INTRAMUSCULAR | Status: DC | PRN
Start: 2015-05-05 — End: 2015-05-05
  Administered 2015-05-05: .8 mg via INTRAVENOUS

## 2015-05-05 MED ORDER — MENTHOL 3 MG MT LOZG: 1.0000 | LOZENGE | OROMUCOSAL | Status: DC | PRN

## 2015-05-05 MED ORDER — KETOROLAC TROMETHAMINE 30 MG/ML IJ SOLN
30.0000 mg | Freq: Once | INTRAMUSCULAR | Status: AC
  Administered 2015-05-05: 30 mg via INTRAVENOUS
  Filled 2015-05-05: qty 1

## 2015-05-05 MED ORDER — CEFAZOLIN SODIUM-DEXTROSE 2-3 GM-% IV SOLR
INTRAVENOUS | Status: AC
  Filled 2015-05-05: qty 50

## 2015-05-05 MED ORDER — VASOPRESSIN 20 UNIT/ML IV SOLN
INTRAVENOUS | Status: AC
  Filled 2015-05-05: qty 1

## 2015-05-05 MED ORDER — GLYCOPYRROLATE 0.2 MG/ML IJ SOLN
INTRAMUSCULAR | Status: AC
  Filled 2015-05-05: qty 3

## 2015-05-05 MED ORDER — IBUPROFEN 600 MG PO TABS
600.0000 mg | ORAL_TABLET | Freq: Four times a day (QID) | ORAL | Status: DC | PRN
  Administered 2015-05-06: 600 mg via ORAL
  Filled 2015-05-05: qty 1

## 2015-05-05 MED ORDER — MICROFIBRILLAR COLL HEMOSTAT EX POWD
CUTANEOUS | Status: DC | PRN
  Administered 2015-05-05: 1 g via TOPICAL

## 2015-05-05 MED ORDER — METOCLOPRAMIDE HCL 5 MG/ML IJ SOLN: 10.0000 mg | Freq: Once | INTRAMUSCULAR | Status: DC | PRN

## 2015-05-05 MED ORDER — MIDAZOLAM HCL 2 MG/2ML IJ SOLN
INTRAMUSCULAR | Status: AC
  Filled 2015-05-05: qty 2

## 2015-05-05 MED ORDER — HYDROMORPHONE HCL 1 MG/ML IJ SOLN
INTRAMUSCULAR | Status: AC
  Filled 2015-05-05: qty 1

## 2015-05-05 MED ORDER — MEPERIDINE HCL 25 MG/ML IJ SOLN
6.2500 mg | INTRAMUSCULAR | Status: DC | PRN
  Administered 2015-05-05 (×2): 6.25 mg via INTRAVENOUS

## 2015-05-05 MED ORDER — ALBUTEROL SULFATE (2.5 MG/3ML) 0.083% IN NEBU: 3.0000 mL | INHALATION_SOLUTION | RESPIRATORY_TRACT | Status: DC | PRN

## 2015-05-05 MED ORDER — HYDROMORPHONE HCL 1 MG/ML IJ SOLN
0.2500 mg | INTRAMUSCULAR | Status: DC | PRN
  Administered 2015-05-05 (×2): 0.5 mg via INTRAVENOUS

## 2015-05-05 MED ORDER — NALOXONE HCL 0.4 MG/ML IJ SOLN: 0.4000 mg | INTRAMUSCULAR | Status: DC | PRN

## 2015-05-05 MED ORDER — DIPHENHYDRAMINE HCL 12.5 MG/5ML PO ELIX: 12.5000 mg | ORAL_SOLUTION | Freq: Four times a day (QID) | ORAL | Status: DC | PRN

## 2015-05-05 MED ORDER — SCOPOLAMINE 1 MG/3DAYS TD PT72
1.0000 | MEDICATED_PATCH | Freq: Once | TRANSDERMAL | Status: DC
  Administered 2015-05-05: 1.5 mg via TRANSDERMAL

## 2015-05-05 MED ORDER — FENTANYL CITRATE (PF) 250 MCG/5ML IJ SOLN
INTRAMUSCULAR | Status: AC
  Filled 2015-05-05: qty 5

## 2015-05-05 MED ORDER — MEPERIDINE HCL 25 MG/ML IJ SOLN
INTRAMUSCULAR | Status: AC
  Administered 2015-05-05: 6.25 mg via INTRAVENOUS
  Filled 2015-05-05: qty 1

## 2015-05-05 MED ORDER — LACTATED RINGERS IV SOLN
INTRAVENOUS | Status: DC
  Administered 2015-05-06 – 2015-05-07 (×4): via INTRAVENOUS

## 2015-05-05 MED ORDER — FENTANYL CITRATE (PF) 100 MCG/2ML IJ SOLN
INTRAMUSCULAR | Status: AC
  Filled 2015-05-05: qty 2

## 2015-05-05 MED ORDER — ONDANSETRON HCL 4 MG PO TABS: 4.0000 mg | ORAL_TABLET | Freq: Four times a day (QID) | ORAL | Status: DC | PRN

## 2015-05-05 MED ORDER — 0.9 % SODIUM CHLORIDE (POUR BTL) OPTIME
TOPICAL | Status: DC | PRN
  Administered 2015-05-05: 1000 mL

## 2015-05-05 MED ORDER — OXYCODONE-ACETAMINOPHEN 5-325 MG PO TABS: 1.0000 | ORAL_TABLET | ORAL | Status: DC | PRN

## 2015-05-05 SURGICAL SUPPLY — 32 items
CANISTER SUCT 3000ML (MISCELLANEOUS) ×3 IMPLANT
CLOTH BEACON ORANGE TIMEOUT ST (SAFETY) ×3 IMPLANT
CONT PATH 16OZ SNAP LID 3702 (MISCELLANEOUS) ×3 IMPLANT
DRAPE WARM FLUID 44X44 (DRAPE) ×3 IMPLANT
DRSG OPSITE POSTOP 4X10 (GAUZE/BANDAGES/DRESSINGS) ×3 IMPLANT
DURAPREP 26ML APPLICATOR (WOUND CARE) ×3 IMPLANT
GAUZE SPONGE 4X4 16PLY XRAY LF (GAUZE/BANDAGES/DRESSINGS) IMPLANT
GLOVE BIO SURGEON STRL SZ7 (GLOVE) ×3 IMPLANT
GLOVE BIOGEL PI IND STRL 7.0 (GLOVE) ×12 IMPLANT
GLOVE BIOGEL PI INDICATOR 7.0 (GLOVE) ×6
GOWN STRL REUS W/TWL LRG LVL3 (GOWN DISPOSABLE) ×9 IMPLANT
NS IRRIG 1000ML POUR BTL (IV SOLUTION) ×3 IMPLANT
PACK ABDOMINAL GYN (CUSTOM PROCEDURE TRAY) ×3 IMPLANT
PAD ABD 8X7 1/2 STERILE (GAUZE/BANDAGES/DRESSINGS) ×3 IMPLANT
PAD OB MATERNITY 4.3X12.25 (PERSONAL CARE ITEMS) ×3 IMPLANT
PENCIL SMOKE EVAC W/HOLSTER (ELECTROSURGICAL) IMPLANT
RTRCTR C-SECT PINK 25CM LRG (MISCELLANEOUS) ×3 IMPLANT
SPONGE GAUZE 4X4 12PLY STER LF (GAUZE/BANDAGES/DRESSINGS) ×3 IMPLANT
SPONGE LAP 18X18 X RAY DECT (DISPOSABLE) ×6 IMPLANT
SUT CHROMIC 2 0 CT 1 (SUTURE) ×6 IMPLANT
SUT PLAIN 2 0 XLH (SUTURE) IMPLANT
SUT VIC AB 0 CT1 27 (SUTURE) ×6
SUT VIC AB 0 CT1 27XBRD ANBCTR (SUTURE) ×2 IMPLANT
SUT VIC AB 0 CT1 27XCR 8 STRN (SUTURE) ×10 IMPLANT
SUT VIC AB 0 CT1 36 (SUTURE) ×9 IMPLANT
SUT VIC AB 2-0 CT2 27 (SUTURE) ×6 IMPLANT
SUT VIC AB 4-0 KS 27 (SUTURE) ×3 IMPLANT
SUT VICRYL 0 TIES 12 18 (SUTURE) ×6 IMPLANT
TAPE CLOTH SURG 4X10 WHT LF (GAUZE/BANDAGES/DRESSINGS) ×3 IMPLANT
TOWEL OR 17X24 6PK STRL BLUE (TOWEL DISPOSABLE) ×6 IMPLANT
TRAY FOLEY CATH SILVER 14FR (SET/KITS/TRAYS/PACK) ×3 IMPLANT
WATER STERILE IRR 1000ML POUR (IV SOLUTION) IMPLANT

## 2015-05-05 NOTE — Brief Op Note (Signed)
05/05/2015  3:42 PM  PATIENT:  Latasha [name]  49 y.o. female  PRE-OPERATIVE DIAGNOSIS:  N92.0 Menorrhagia D25.1 Fibroids  POST-OPERATIVE DIAGNOSIS:  Same  PROCEDURE:  Procedure(s): HYSTERECTOMY ABDOMINAL (N/A) BILATERAL SALPINGECTOMY (Bilateral)  SURGEON:  Surgeon(s) and Role:    * Thurnell Lose, MD - Primary    * Janyth Pupa, DO - Assisting  PHYSICIAN ASSISTANT: None  ASSISTANTS: Technician   ANESTHESIA:   general  EBL:  Total I/O In: 2000 [I.V.:2000] Out: 700 [Urine:150; Blood:550]  BLOOD ADMINISTERED:none  DRAINS: Urinary Catheter (Foley)   LOCAL MEDICATIONS USED:  NONE  SPECIMEN:  Source of Specimen:  uterus, cervix , bilateral fallopian tubes  DISPOSITION OF SPECIMEN:  PATHOLOGY  COUNTS:  YES  TOURNIQUET:  * No tourniquets in log *  DICTATION: .Other Dictation: Dictation Number [id number]  PLAN OF CARE: Admit to inpatient   PATIENT DISPOSITION:  PACU - hemodynamically stable.   Delay start of Pharmacological VTE agent (>24hrs) due to surgical blood loss or risk of bleeding: yes

## 2015-05-05 NOTE — Transfer of Care (Signed)
Immediate Anesthesia Transfer of Care Note  Patient: Latasha [name]  Procedure(s) Performed: Procedure(s): HYSTERECTOMY ABDOMINAL (N/A) BILATERAL SALPINGECTOMY (Bilateral)  Patient Location: PACU  Anesthesia Type:General  Level of Consciousness: awake  Airway & Oxygen Therapy: Patient Spontanous Breathing  Post-op Assessment: Report given to PACU RN  Post vital signs: stable  Filed Vitals:   05/05/15 1051  BP: 124/95  Pulse: 80  Temp: 36.8 C  Resp: 20    Complications: No apparent anesthesia complications

## 2015-05-05 NOTE — Anesthesia Preprocedure Evaluation (Signed)
Anesthesia Evaluation  Patient identified by MRN, date of birth, ID band Patient awake    Reviewed: Allergy & Precautions, NPO status , Patient's Chart, lab work & pertinent test results  Airway Mallampati: III  TM Distance: >3 FB Neck ROM: Full    Dental no notable dental hx. (+) Caps   Pulmonary neg pulmonary ROS,    Pulmonary exam normal breath sounds clear to auscultation       Cardiovascular negative cardio ROS Normal cardiovascular exam Rhythm:Regular Rate:Normal     Neuro/Psych  Headaches, PSYCHIATRIC DISORDERS Depression    GI/Hepatic negative GI ROS, Neg liver ROS,   Endo/Other  Morbid obesity  Renal/GU negative Renal ROS  negative genitourinary   Musculoskeletal  (+) Arthritis ,   Abdominal (+) + obese,   Peds  Hematology   Anesthesia Other Findings   Reproductive/Obstetrics Fibroid Uterus Menorrhagia                             Anesthesia Physical Anesthesia Plan  ASA: III  Anesthesia Plan: General   Post-op Pain Management:    Induction: Intravenous  Airway Management Planned: Oral ETT  Additional Equipment:   Intra-op Plan:   Post-operative Plan: Extubation in OR  Informed Consent: I have reviewed the patients History and Physical, chart, labs and discussed the procedure including the risks, benefits and alternatives for the proposed anesthesia with the patient or authorized representative who has indicated his/her understanding and acceptance.   Dental advisory given  Plan Discussed with: CRNA, Anesthesiologist and Surgeon  Anesthesia Plan Comments:         Anesthesia Quick Evaluation

## 2015-05-05 NOTE — Anesthesia Postprocedure Evaluation (Signed)
Anesthesia Post Note  Patient: Latasha [name]  Procedure(s) Performed: Procedure(s) (LRB): HYSTERECTOMY ABDOMINAL (N/A) BILATERAL SALPINGECTOMY (Bilateral)  Patient location during evaluation: PACU Anesthesia Type: General Level of consciousness: awake and alert Pain management: pain level controlled Vital Signs Assessment: post-procedure vital signs reviewed and stable Respiratory status: spontaneous breathing, nonlabored ventilation, respiratory function stable and patient connected to nasal cannula oxygen Cardiovascular status: blood pressure returned to baseline and stable Postop Assessment: no signs of nausea or vomiting Anesthetic complications: no    Last Vitals:  Filed Vitals:   05/05/15 1630 05/05/15 1645  BP: 108/74 112/80  Pulse: 66 64  Temp:    Resp: 16 12    Last Pain:  Filed Vitals:   05/05/15 1652  PainSc: Asleep                 [name]

## 2015-05-05 NOTE — Anesthesia Procedure Notes (Signed)
Procedure Name: Intubation Date/Time: 05/05/2015 12:39 PM Performed by: Bufford Spikes Pre-anesthesia Checklist: Patient identified, Timeout performed, Emergency Drugs available, Suction available and Patient being monitored Patient Re-evaluated:Patient Re-evaluated prior to inductionOxygen Delivery Method: Circle system utilized Preoxygenation: Pre-oxygenation with 100% oxygen Intubation Type: IV induction Ventilation: Mask ventilation without difficulty Laryngoscope Size: Miller and 2 Grade View: Grade II Tube type: Oral Tube size: 7.0 mm Number of attempts: 1 Airway Equipment and Method: Stylet Placement Confirmation: ETT inserted through vocal cords under direct vision,  positive ETCO2 and breath sounds checked- equal and bilateral Secured at: 21 cm Tube secured with: Tape Dental Injury: Teeth and Oropharynx as per pre-operative assessment

## 2015-05-06 ENCOUNTER — Encounter (HOSPITAL_COMMUNITY): Payer: Self-pay | Admitting: Obstetrics and Gynecology

## 2015-05-06 LAB — BASIC METABOLIC PANEL
ANION GAP: 5 (ref 5–15)
BUN: 10 mg/dL (ref 6–20)
CHLORIDE: 106 mmol/L (ref 101–111)
CO2: 25 mmol/L (ref 22–32)
CREATININE: 0.88 mg/dL (ref 0.44–1.00)
Calcium: 8.5 mg/dL — ABNORMAL LOW (ref 8.9–10.3)
GFR calc non Af Amer: >60 mL/min (ref >60)
Glucose, Bld: 116 mg/dL — ABNORMAL HIGH (ref 65–99)
POTASSIUM: 4.1 mmol/L (ref 3.5–5.1)
SODIUM: 136 mmol/L (ref 135–145)

## 2015-05-06 LAB — CBC
HEMATOCRIT: 24.1 % — AB (ref 36.0–46.0)
HEMOGLOBIN: 8.1 g/dL — AB (ref 12.0–15.0)
MCH: 23.7 pg — ABNORMAL LOW (ref 26.0–34.0)
MCHC: 33.6 g/dL (ref 30.0–36.0)
MCV: 70.5 fL — ABNORMAL LOW (ref 78.0–100.0)
Platelets: 314 10*3/uL (ref 150–400)
RBC: 3.42 MIL/uL — AB (ref 3.87–5.11)
RDW: 19.5 % — ABNORMAL HIGH (ref 11.5–15.5)
WBC: 10.4 10*3/uL (ref 4.0–10.5)

## 2015-05-06 MED ORDER — OXYCODONE-ACETAMINOPHEN 7.5-325 MG PO TABS
1.0000 | ORAL_TABLET | Freq: Four times a day (QID) | ORAL | Status: DC | PRN
  Administered 2015-05-06 (×2): 1 via ORAL
  Administered 2015-05-06 – 2015-05-07 (×4): 2 via ORAL
  Filled 2015-05-06: qty 2
  Filled 2015-05-06 (×2): qty 1
  Filled 2015-05-06 (×3): qty 2

## 2015-05-06 MED ORDER — MELOXICAM 7.5 MG PO TABS
15.0000 mg | ORAL_TABLET | Freq: Every day | ORAL | Status: DC | PRN
  Administered 2015-05-06: 15 mg via ORAL
  Filled 2015-05-06 (×2): qty 2

## 2015-05-06 MED ORDER — TRAMADOL HCL 50 MG PO TABS
50.0000 mg | ORAL_TABLET | Freq: Four times a day (QID) | ORAL | Status: DC | PRN
  Administered 2015-05-06 – 2015-05-07 (×5): 50 mg via ORAL
  Filled 2015-05-06 (×5): qty 1
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
## 2015-05-06 NOTE — Op Note (Signed)
Latasha [name], Latasha [name]          ACCOUNT NO.:  [id number]  MEDICAL RECORD NO.:  [id number]  LOCATION:  9309                          FACILITY:  [facility]  PHYSICIAN:  Jola Schmidt, MD   DATE OF BIRTH:  1965-09-13  DATE OF PROCEDURE:  05/05/2015 DATE OF DISCHARGE:                              OPERATIVE REPORT   PREOPERATIVE DIAGNOSES:  Menorrhagia and uterine fibroids.  POSTOPERATIVE DIAGNOSES:  Menorrhagia and uterine fibroids.  PROCEDURE:  Abdominal hysterectomy and bilateral salpingectomy.  SURGEON:  Jola Schmidt, M.D.  ASSISTANT:  Dr. Janyth Pupa.  PHYSICIAN ASSISTANT:  None.  ASSISTANT:  [employer] officer.  ANESTHESIA:  General.  EBL:  550 mL.  URINE OUTPUT:  150 mL of clear urine at the end of procedure.  BLOOD ADMINISTERED:  None.  DRAINS:  Urine catheter, Foley.  LOCAL:  None.  SPECIMEN:  Uterus, cervix, and bilateral fallopian tubes.  The specimen is approximately 600 g.  DISPOSITION:  Disposition of specimen to Pathology.  COMPLICATIONS:  None.  FINDINGS:  A large 9 cm vascular anterior fundal fibroid.  Ovaries and tubes appeared normal, bilaterally consistent with previous tubal ligation on the left.  However, right side appeared intact.  Bleeding from bladder, but no masses noted.  PROCEDURE IN DETAIL:  Latasha [name] was taken to the operating room with IV running.  She underwent general endotracheal anesthesia without complications.  She was then prepped and draped in a normal sterile fashion.  A time-out was performed.  She received Ancef 2 g IV prior to the procedure and SCDs were on and operating.  The abdomen was marked for a low transverse Pfannenstiel skin incision. Incision was made with a scalpel and the subcutaneous space was opened with the Bovie down to the underlying layer of the fascia.  The patient had a lot of adipose tissue, and it was very deep.  Once the fascia was incised at the midline and was extended laterally.  Because, I  think we are little bit lower her rectus muscles and the sheath was almost vertical, when we had to separate in an effort to avoid the rectus muscle.  So the fascia was opened in 2 layers.  The rectus muscles or the fascia was then grasped with the Kocher clamps and dissected sharply off the fascia with the Mayo scissors.  There was a small defect in the fascia that was repaired with a figure-of-eight with #0 Vicryl, both on the anterior right portion of the fascia.  The rectus muscles were then separated with the hemostat.  The peritoneum was identified and entered sharply after tenting up with hemostats and Metzenbaum were clearly seen through the peritoneum and was opened with the Metzenbaum. Intraabdominal access was confirmed and abdomen and peritoneum was stretched.  The pyramidalis muscle was transected prior to stretching of the peritoneum.  A large fibroid uterus was noted.  It was easily delivered through the incision.  Alexis retractor was then placed.  The fibroid was dominant presenting part of the uterus and it actually rotated the uterus.  The round ligaments were identified and transected with Kocher clamps and opened.  They were suture ligated with #0 Vicryl. All remaining suture was #0 Vicryl unless otherwise stated.  The fallopian tubes were then transected, making a window in the peritoneum of the broad ligament that was done with a Haney clamp, suture ligated, and Haney for blood loss.  Back bleeding was managed with freehand ties #0 Vicryl.  Once the fallopian tube was transected, the same was done on the opposite side.  The utero-ovarian ligament was then transected.  The patient had a lot of large blood vessels that were concerning and consistent with her history of fibroids that was double clamped with Haney's and transected freehand tied suture ligation was performed.  On the right side, once the round ligament was transected.  The bladder flap was easily  developed by incising the peritoneum with the Metzenbaum scissors.  The bladder was easily pushed down.  The pedicle was taken on the right-hand side without difficulty.  The broad ligament was skeletonized so that uterine arteries were well visualized.  The uterine artery was very tortuous and very clearly seen that was taken with a curved Haney and transected and suture ligated. However, there was back bleeding and had to do 2 figure-of-eight to control the bleeding, due to the aberrant vessels.  The same was done on the opposite side of the uterus.  On the left side, visualization was more difficult, but we were still able to the control the bleeding. Kelly clamps were used for back bleeding as well, in addition to sutures.  As soon as we were able to transect the uterine arteries, the uterus blanched and we were able to amputate the uterus from the cervix.  The cervical stump was grasped with 2 single-tooth tenaculum.  Of note, for mobilization of the uterus, I placed a cross stitch in the fundus of the uterus for mobilization with a 0 Vicryl.  Also, moist laparotomy sponges were placed in the abdomen for retraction of the bowel.  For visualization, we had to use a narrow Deaver on the pelvic side wall.  Once the cervical stump was grasped with a single-tooth tenaculum, the cervix was skeletonized with straight Haney's and eventually the uterosacral ligaments and angles with the curved Mayo scissors were tagged and transected and suture ligated.  Angle stitches were performed bilaterally.  There was a small extension of the vaginal laceration on the left-hand side that was reapproximated and made hemostatic.  The cuff was closed in a figure-of-eight.  The patient did have significant oozing, so I than ran the cuff back over with a #0 Vicryl.  There was still some bleeding behind the bladder that was then over sewn to the vaginal cuff and then the posterior portion of the vaginal  cuff, there was rectum coming up close it so, the vaginal cuff, a small layer appeared to be bleeding and when that did not respond to Bovie cauterization, that was oversewn with 2-0 Vicryl.  Bleeding seemed to be hemostatic.  Irrigation was performed.  Pressure was held.  The patient had a significant oozing from posterior to the bladder on the right, then it was the left.  It slowed with Bovie and pressure and with over- sewing, but still was kind of oozy.  There was no discrete vessel that was bleeding.  The patient was on NSAIDs and aspirin prior to the surgery, but that was stopped, so it was decided to place Avitene and look for any significant pooling.  Avitene was placed and no significant pooling was noted.  At that time, it was decided to that hemostasis would be adequate with the Avitene in place.  The ovarian pedicles were plicated to the round ligament.  Then, the peritoneum was then reapproximated with 2-0 Vicryl in a continuous running fashion.  There was some bleeding when we tied off that particular site.  That was cauterized as well.  With the closure, a malleable was placed to protect the bowel.  The fascia was then reapproximated, so because we almost had a vertical opening of the fascia, there was some tension when we started to close the fascia; however, we ran it from the patient's right and did not tie off at the midline, ran it from the left and tied almost near the midline and did another figure-of-eight suture with 0 Vicryl, so there was so much tension on the incision.  The subcutaneous space was then closed in 2 layers with 2-0 plain gut in a continuous running fashion.  The skin was reapproximated with 4-0 Monocryl on a Keith needle.  Pressure dressing was applied due to all of the oozing noted at the time of the closure of the procedure.  The suture, a 4-0 Vicryl and a Keith needle was closed with a Z stitch. Pressure dressing was applied.  All  instruments, sponge, and needle counts were correct x3.  The patient tolerated the procedure well.  She was taken to the operating room in stable condition.     Jola Schmidt, MD     EBV/MEDQ  D:  05/05/2015  T:  05/06/2015  Job:  [id number]

## 2015-05-06 NOTE — Anesthesia Postprocedure Evaluation (Signed)
Anesthesia Post Note  Patient: Latasha [name]  Procedure(s) Performed: Procedure(s) (LRB): HYSTERECTOMY ABDOMINAL (N/A) BILATERAL SALPINGECTOMY (Bilateral)  Patient location during evaluation: [facility] Anesthesia Type: General Level of consciousness: awake and alert and oriented Pain management: pain level controlled Vital Signs Assessment: post-procedure vital signs reviewed and stable Respiratory status: spontaneous breathing and nonlabored ventilation Cardiovascular status: stable Postop Assessment: no signs of nausea or vomiting and adequate PO intake Anesthetic complications: no    Last Vitals:  Filed Vitals:   05/06/15 0150 05/06/15 0521  BP: 112/67 115/68  Pulse: 78 79  Temp: 36.9 C 37.1 C  Resp: 18 19    Last Pain:  Filed Vitals:   05/06/15 0526  PainSc: 7                  Cheris Tweten

## 2015-05-06 NOTE — Addendum Note (Signed)
Addendum  created 05/06/15 0745 by Jonna Munro, CRNA   Modules edited: Clinical Notes   Clinical Notes:  File: [id number]

## 2015-05-07 MED ORDER — MELOXICAM 15 MG PO TABS: 15.0000 mg | ORAL_TABLET | Freq: Every day | ORAL | Status: DC | PRN

## 2015-05-07 MED ORDER — TRAMADOL HCL 50 MG PO TABS: 50.0000 mg | ORAL_TABLET | Freq: Four times a day (QID) | ORAL | Status: DC | PRN

## 2015-05-07 MED ORDER — OXYCODONE-ACETAMINOPHEN 5-325 MG PO TABS: 1.0000 | ORAL_TABLET | Freq: Four times a day (QID) | ORAL | Status: DC | PRN

## 2015-05-07 MED ORDER — BISACODYL 10 MG RE SUPP
10.0000 mg | Freq: Once | RECTAL | Status: AC
  Administered 2015-05-07: 10 mg via RECTAL
  Filled 2015-05-07: qty 1

## 2015-05-07 NOTE — Progress Notes (Signed)
Patient ambulating in hallway and states to RN that she vomited. RN asked if patient saved for RN to visualize to and patient stated she vomited in the toilet and flushed. RN reiterated importance of letting RN visualize in order to measure amount of emesis. Patient voiced understanding... Will continue to monitor.

## 2015-05-07 NOTE — Discharge Instructions (Signed)
Abdominal Hysterectomy, Care After °Refer to this sheet in the next few weeks. These instructions provide you with information on caring for yourself after your procedure. Your health care provider may also give you more specific instructions. Your treatment has been planned according to current medical practices, but problems sometimes occur. Call your health care provider if you have any problems or questions after your procedure.  °WHAT TO EXPECT AFTER THE PROCEDURE °After your procedure, it is typical to have the following: °· Pain. °· Feeling tired. °· Poor appetite. °· Less interest in sex. °It takes 4-6 weeks to recover from this surgery.  °HOME CARE INSTRUCTIONS  °· Take pain medicines only as directed by your health care provider. Do not take over-the-counter pain medicines without checking with your health care provider first.  °· Change your bandage as directed by your health care provider. °· Return to your health care provider to have your sutures taken out. °· Take showers instead of baths for 2-3 weeks. Ask your health care provider when it is safe to start showering.  °· Do not douche, use tampons, or have sexual intercourse for at least 6 weeks or until your health care provider says you can.   °· Follow your health care provider's advice about exercise, lifting, driving, and general activities. °· Get plenty of rest and sleep.   °· Do not lift anything heavier than a gallon of milk (about 10 lb [4.5 kg]) for the first month after surgery. °· You can resume your normal diet if your health care provider says it is okay.   °· Do not drink alcohol until your health care provider says you can.   °· If you are constipated, ask your health care provider if you can take a mild laxative. °· Eating foods high in fiber may also help with constipation. Eat plenty of raw fruits and vegetables, whole grains, and beans. °· Drink enough fluids to keep your urine clear or pale yellow.   °· Try to have someone at  home with you for the first 1-2 weeks to help around the house. °· Keep all follow-up appointments. °SEEK MEDICAL CARE IF:  °· You have chills or fever. °· You have swelling, redness, or pain in the area of your incision that is getting worse.   °· You have pus coming from the incision.   °· You notice a bad smell coming from the incision or bandage.   °· Your incision breaks open.   °· You feel dizzy or light-headed.   °· You have pain or bleeding when you urinate.   °· You have persistent diarrhea.   °· You have persistent nausea and vomiting.   °· You have abnormal vaginal discharge.   °· You have a rash.   °· You have any type of abnormal reaction or develop an allergy to your medicine.   °· Your pain medicine is not helping.   °SEEK IMMEDIATE MEDICAL CARE IF:  °· You have a fever and your symptoms suddenly get worse. °· You have severe abdominal pain. °· You have chest pain. °· You have shortness of breath. °· You faint. °· You have pain, swelling, or redness of your leg. °· You have heavy vaginal bleeding with blood clots. °MAKE SURE YOU: °· Understand these instructions. °· Will watch your condition. °· Will get help right away if you are not doing well or get worse. °  °This information is not intended to replace advice given to you by your health care provider. Make sure you discuss any questions you have with your health care provider. °  °Document   Released: 12/09/2004 Document Revised: 06/12/2014 Document Reviewed: 03/14/2013 °Elsevier Interactive Patient Education ©2016 [employer] ° °

## 2015-05-07 NOTE — Progress Notes (Signed)
Patient discharged home with husband... Discharge instructions reviewed with patient and she verbalized understanding... Condition stable... No equipment... Taken to car via wheelchair by S. Isaiah Blakes, NT.

## 2015-05-10 ENCOUNTER — Encounter (HOSPITAL_COMMUNITY): Payer: Self-pay

## 2015-05-10 ENCOUNTER — Inpatient Hospital Stay (HOSPITAL_COMMUNITY)
Admission: AD | Admit: 2015-05-10 | Discharge: 2015-05-10 | Disposition: A | Discharge status: Home / Self Care | Payer: Federal, State, Local not specified - PPO | Source: Ambulatory Visit | Attending: Obstetrics and Gynecology | Admitting: Obstetrics and Gynecology

## 2015-05-10 DIAGNOSIS — R1032 Left lower quadrant pain: Secondary | ICD-10-CM | POA: Insufficient documentation

## 2015-05-10 DIAGNOSIS — Z90710 Acquired absence of both cervix and uterus: Secondary | ICD-10-CM | POA: Diagnosis not present

## 2015-05-10 DIAGNOSIS — R102 Pelvic and perineal pain: Secondary | ICD-10-CM | POA: Diagnosis not present

## 2015-05-10 DIAGNOSIS — K5900 Constipation, unspecified: Secondary | ICD-10-CM | POA: Diagnosis not present

## 2015-05-10 LAB — CBC
HEMATOCRIT: 24.6 % — AB (ref 36.0–46.0)
HEMOGLOBIN: 8.1 g/dL — AB (ref 12.0–15.0)
MCH: 23.2 pg — ABNORMAL LOW (ref 26.0–34.0)
MCHC: 32.9 g/dL (ref 30.0–36.0)
MCV: 70.5 fL — ABNORMAL LOW (ref 78.0–100.0)
Platelets: 276 10*3/uL (ref 150–400)
RBC: 3.49 MIL/uL — AB (ref 3.87–5.11)
RDW: 19.2 % — ABNORMAL HIGH (ref 11.5–15.5)
WBC: 8 10*3/uL (ref 4.0–10.5)

## 2015-05-10 MED ORDER — BISACODYL 10 MG RE SUPP
10.0000 mg | Freq: Once | RECTAL | Status: AC
  Administered 2015-05-10: 10 mg via RECTAL
  Filled 2015-05-10: qty 1

## 2015-05-10 MED ORDER — HYDROMORPHONE HCL 2 MG PO TABS: 2.0000 mg | ORAL_TABLET | ORAL | Status: DC | PRN

## 2015-05-10 MED ORDER — HYDROMORPHONE HCL 2 MG PO TABS
2.0000 mg | ORAL_TABLET | Freq: Once | ORAL | Status: AC
Start: 2015-05-10 — End: 2015-05-10
  Administered 2015-05-10: 2 mg via ORAL
  Filled 2015-05-10: qty 1

## 2015-05-10 NOTE — MAU Provider Note (Signed)
Dr Landry Mellow to bedside to evaluate pt.  Pt discharged per Dr Landry Mellow, RN took verbal order. Dilaudid 2 mg PO Q 4 hours PRN x 30 tabs with no refills, Rx printed for pt prior to discharge per Dr Landry Mellow.

## 2015-05-10 NOTE — MAU Note (Signed)
Pt presents to MAU with complaints of lower abdominal pain. Pt had a hysterectomy on November the 30th by Dr Simona Huh.  Pt has been taking her pain meds as prescribed but the pain is still bad
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
## 2015-05-27 ENCOUNTER — Encounter (HOSPITAL_COMMUNITY): Payer: Self-pay

## 2015-05-27 ENCOUNTER — Inpatient Hospital Stay (HOSPITAL_COMMUNITY)
Admission: AD | Admit: 2015-05-27 | Discharge: 2015-05-28 | Disposition: A | Discharge status: Home / Self Care | Payer: Federal, State, Local not specified - PPO | Source: Ambulatory Visit | Attending: Obstetrics and Gynecology | Admitting: Obstetrics and Gynecology

## 2015-05-27 DIAGNOSIS — M545 Low back pain, unspecified: Secondary | ICD-10-CM

## 2015-05-27 DIAGNOSIS — Z90710 Acquired absence of both cervix and uterus: Secondary | ICD-10-CM | POA: Insufficient documentation

## 2015-05-27 DIAGNOSIS — R1032 Left lower quadrant pain: Secondary | ICD-10-CM | POA: Diagnosis present

## 2015-05-27 DIAGNOSIS — Z9889 Other specified postprocedural states: Secondary | ICD-10-CM | POA: Insufficient documentation

## 2015-05-27 DIAGNOSIS — R110 Nausea: Secondary | ICD-10-CM | POA: Diagnosis not present

## 2015-05-27 DIAGNOSIS — Z8781 Personal history of (healed) traumatic fracture: Secondary | ICD-10-CM | POA: Insufficient documentation

## 2015-05-27 DIAGNOSIS — G894 Chronic pain syndrome: Secondary | ICD-10-CM | POA: Diagnosis not present

## 2015-05-27 DIAGNOSIS — K5900 Constipation, unspecified: Secondary | ICD-10-CM | POA: Diagnosis not present

## 2015-05-27 MED ORDER — HYDROMORPHONE HCL 1 MG/ML IJ SOLN: 2.0000 mg | Freq: Once | INTRAMUSCULAR | Status: DC

## 2015-05-27 NOTE — MAU Note (Signed)
Pt presents complaining of left side pain that starts at her lower abdomen up the left side. States the pain is just getting worse and worse.

## 2015-05-27 NOTE — Progress Notes (Signed)
Notified of pt discomfort level
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
## 2015-05-28 DIAGNOSIS — M545 Low back pain: Secondary | ICD-10-CM | POA: Diagnosis not present

## 2015-05-28 LAB — CBC
HCT: 27.3 % — ABNORMAL LOW (ref 36.0–46.0)
HEMOGLOBIN: 8.7 g/dL — AB (ref 12.0–15.0)
MCH: 22.4 pg — AB (ref 26.0–34.0)
MCHC: 31.9 g/dL (ref 30.0–36.0)
MCV: 70.2 fL — AB (ref 78.0–100.0)
Platelets: 434 10*3/uL — ABNORMAL HIGH (ref 150–400)
RBC: 3.89 MIL/uL (ref 3.87–5.11)
RDW: 19.1 % — ABNORMAL HIGH (ref 11.5–15.5)
WBC: 6.8 10*3/uL (ref 4.0–10.5)

## 2015-05-28 LAB — COMPREHENSIVE METABOLIC PANEL
ALK PHOS: 57 U/L (ref 38–126)
ALT: 13 U/L — AB (ref 14–54)
ANION GAP: 7 (ref 5–15)
AST: 16 U/L (ref 15–41)
Albumin: 3.7 g/dL (ref 3.5–5.0)
BUN: 10 mg/dL (ref 6–20)
CALCIUM: 9.4 mg/dL (ref 8.9–10.3)
CO2: 24 mmol/L (ref 22–32)
CREATININE: 0.99 mg/dL (ref 0.44–1.00)
Chloride: 109 mmol/L (ref 101–111)
Glucose, Bld: 110 mg/dL — ABNORMAL HIGH (ref 65–99)
Potassium: 3.8 mmol/L (ref 3.5–5.1)
Sodium: 140 mmol/L (ref 135–145)
TOTAL PROTEIN: 8 g/dL (ref 6.5–8.1)
Total Bilirubin: 0.8 mg/dL (ref 0.3–1.2)

## 2015-05-28 LAB — URINALYSIS, ROUTINE W REFLEX MICROSCOPIC
BILIRUBIN URINE: NEGATIVE
GLUCOSE, UA: NEGATIVE mg/dL
KETONES UR: NEGATIVE mg/dL
Nitrite: NEGATIVE
Protein, ur: 30 mg/dL — AB
SPECIFIC GRAVITY, URINE: 1.025 (ref 1.005–1.030)
pH: 6.5 (ref 5.0–8.0)

## 2015-05-28 LAB — AMYLASE: AMYLASE: 59 U/L (ref 28–100)

## 2015-05-28 LAB — URINE MICROSCOPIC-ADD ON

## 2015-05-28 LAB — LIPASE, BLOOD: LIPASE: 48 U/L (ref 11–51)

## 2015-05-28 MED ORDER — CYCLOBENZAPRINE HCL 10 MG PO TABS
10.0000 mg | ORAL_TABLET | Freq: Once | ORAL | Status: AC
Start: 2015-05-28 — End: 2015-05-28
  Administered 2015-05-28: 10 mg via ORAL
  Filled 2015-05-28: qty 1

## 2015-05-28 MED ORDER — METHYLPREDNISOLONE 4 MG PO TBPK: ORAL_TABLET | ORAL | Status: DC

## 2015-05-28 NOTE — Discharge Instructions (Signed)

## 2015-06-09 NOTE — Discharge Summary (Signed)
Physician Discharge Summary  Patient ID: Latasha [name] MRN: [id number] DOB/AGE: September 05, 1965 50 y.o.  Admit date: 05/05/2015 Discharge date: 05/07/15  Admission Diagnoses:  Menorrhagia, uterine fibroids  Discharge Diagnoses: Same Principal Problem:   S/P TAH (total abdominal hysterectomy) S/p Bilateral salpingectomy  Discharged Condition: good  Hospital Course: Pt underwent TAH/BS without complication.  EBL was 550.  Pt's pain was not initially well controlled with Dilaudid PCA but she was transitioned to po pain medications.  Ambulating, tolerating po, passing gas at discharge.  Consults: None  Significant Diagnostic Studies: labs: hg 10.2 to 8.1  Treatments: surgery: as above  Discharge Exam: Blood pressure 107/71, pulse 71, temperature 98.4 F (36.9 C), temperature source Oral, resp. rate 18, height 5\' 2"  (1.575 m), weight 99.791 kg (220 lb), SpO2 98 %. Gen:  NAD, A&O x 3 Abd:  Mild to moderately tender, incision clean, active BS Ext: No calf tenderness  Disposition: 01-Home or Self Care  Discharge Instructions    Call MD for:  extreme fatigue    Complete by:  As directed      Call MD for:  persistant dizziness or light-headedness    Complete by:  As directed      Call MD for:  persistant nausea and vomiting    Complete by:  As directed      Call MD for:  redness, tenderness, or signs of infection (pain, swelling, redness, odor or green/yellow discharge around incision site)    Complete by:  As directed      Call MD for:  severe uncontrolled pain    Complete by:  As directed      Diet - low sodium heart healthy    Complete by:  As directed      Discharge instructions    Complete by:  As directed   See discharge instructions.     Discharge wound care:    Complete by:  As directed   Keep incision clean and dry once strips fall off.     Driving Restrictions    Complete by:  As directed   No driving for 2 weeks and/or while using narcotics.     Increase  activity slowly    Complete by:  As directed      Lifting restrictions    Complete by:  As directed   No heavy lifting greater than 15 pounds.     Remove dressing in 48 hours    Complete by:  As directed             Medication List    STOP taking these medications        aspirin EC 325 MG tablet     cephALEXin 500 MG capsule  Commonly known as:  KEFLEX     oxyCODONE-acetaminophen 5-325 MG tablet  Commonly known as:  ROXICET      TAKE these medications        meloxicam 15 MG tablet  Commonly known as:  MOBIC  Take 1 tablet (15 mg total) by mouth daily as needed (pain).     traMADol 50 MG tablet  Commonly known as:  ULTRAM  Take 1 tablet (50 mg total) by mouth every 6 (six) hours as needed for severe pain.           Follow-up Information    Follow up with Thurnell Lose, MD. Schedule an appointment as soon as possible for a visit in 2 weeks.   Specialty:  Obstetrics and Gynecology   Why:  Post op check up   Contact information:   [address] [place] [address] [phone]       Signed: Thurnell Lose 06/09/2015, 11:13 PM

## 2016-04-30 ENCOUNTER — Emergency Department (HOSPITAL_BASED_OUTPATIENT_CLINIC_OR_DEPARTMENT_OTHER)
Admission: EM | Admit: 2016-04-30 | Discharge: 2016-04-30 | Disposition: A | Discharge status: Home / Self Care | Payer: Federal, State, Local not specified - PPO | Attending: Emergency Medicine | Admitting: Emergency Medicine

## 2016-04-30 ENCOUNTER — Emergency Department (HOSPITAL_BASED_OUTPATIENT_CLINIC_OR_DEPARTMENT_OTHER): Payer: Federal, State, Local not specified - PPO

## 2016-04-30 ENCOUNTER — Encounter (HOSPITAL_BASED_OUTPATIENT_CLINIC_OR_DEPARTMENT_OTHER): Payer: Self-pay | Admitting: Adult Health

## 2016-04-30 DIAGNOSIS — Y9222 Religious institution as the place of occurrence of the external cause: Secondary | ICD-10-CM | POA: Insufficient documentation

## 2016-04-30 DIAGNOSIS — Y939 Activity, unspecified: Secondary | ICD-10-CM | POA: Insufficient documentation

## 2016-04-30 DIAGNOSIS — Z79899 Other long term (current) drug therapy: Secondary | ICD-10-CM | POA: Insufficient documentation

## 2016-04-30 DIAGNOSIS — R55 Syncope and collapse: Secondary | ICD-10-CM | POA: Diagnosis not present

## 2016-04-30 DIAGNOSIS — Y999 Unspecified external cause status: Secondary | ICD-10-CM | POA: Diagnosis not present

## 2016-04-30 DIAGNOSIS — R0781 Pleurodynia: Secondary | ICD-10-CM | POA: Diagnosis not present

## 2016-04-30 DIAGNOSIS — S299XXA Unspecified injury of thorax, initial encounter: Secondary | ICD-10-CM | POA: Diagnosis present

## 2016-04-30 DIAGNOSIS — W19XXXA Unspecified fall, initial encounter: Secondary | ICD-10-CM | POA: Insufficient documentation

## 2016-04-30 LAB — URINALYSIS, ROUTINE W REFLEX MICROSCOPIC
BILIRUBIN URINE: NEGATIVE
GLUCOSE, UA: NEGATIVE mg/dL
KETONES UR: NEGATIVE mg/dL
Leukocytes, UA: NEGATIVE
Nitrite: NEGATIVE
PH: 5.5 (ref 5.0–8.0)
Protein, ur: 30 mg/dL — AB
Specific Gravity, Urine: 1.02 (ref 1.005–1.030)

## 2016-04-30 LAB — URINE MICROSCOPIC-ADD ON

## 2016-04-30 LAB — CBC
HCT: 38.9 % (ref 36.0–46.0)
Hemoglobin: 13.9 g/dL (ref 12.0–15.0)
MCH: 28.7 pg (ref 26.0–34.0)
MCHC: 35.7 g/dL (ref 30.0–36.0)
MCV: 80.4 fL (ref 78.0–100.0)
PLATELETS: 220 10*3/uL (ref 150–400)
RBC: 4.84 MIL/uL (ref 3.87–5.11)
RDW: 14 % (ref 11.5–15.5)
WBC: 7.3 10*3/uL (ref 4.0–10.5)

## 2016-04-30 LAB — BASIC METABOLIC PANEL
Anion gap: 7 (ref 5–15)
BUN: 14 mg/dL (ref 6–20)
CO2: 25 mmol/L (ref 22–32)
CREATININE: 0.95 mg/dL (ref 0.44–1.00)
Calcium: 8.8 mg/dL — ABNORMAL LOW (ref 8.9–10.3)
Chloride: 107 mmol/L (ref 101–111)
GFR calc Af Amer: >60 mL/min (ref >60)
Glucose, Bld: 83 mg/dL (ref 65–99)
POTASSIUM: 3.9 mmol/L (ref 3.5–5.1)
SODIUM: 139 mmol/L (ref 135–145)

## 2016-04-30 LAB — TROPONIN I: Troponin I: <0.03 ng/mL (ref <0.03)

## 2016-04-30 LAB — CBG MONITORING, ED: Glucose-Capillary: 83 mg/dL (ref 65–99)

## 2016-04-30 MED ORDER — SODIUM CHLORIDE 0.9 % IV BOLUS (SEPSIS): 1000.0000 mL | Freq: Once | INTRAVENOUS | Status: DC

## 2016-04-30 MED ORDER — LIDOCAINE 5 % EX PTCH: 1.0000 | MEDICATED_PATCH | CUTANEOUS | 0 refills | Status: DC

## 2016-04-30 MED ORDER — MORPHINE SULFATE (PF) 4 MG/ML IV SOLN
4.0000 mg | Freq: Once | INTRAVENOUS | Status: DC
  Filled 2016-04-30: qty 1

## 2016-04-30 MED ORDER — MORPHINE SULFATE (PF) 4 MG/ML IV SOLN: 4.0000 mg | Freq: Once | INTRAVENOUS | Status: DC

## 2016-04-30 MED ORDER — MECLIZINE HCL 25 MG PO TABS
25.0000 mg | ORAL_TABLET | Freq: Once | ORAL | Status: AC
Start: 2016-04-30 — End: 2016-04-30
  Administered 2016-04-30: 25 mg via ORAL
  Filled 2016-04-30: qty 1

## 2016-04-30 MED ORDER — KETOROLAC TROMETHAMINE 30 MG/ML IJ SOLN
30.0000 mg | Freq: Once | INTRAMUSCULAR | Status: AC
  Administered 2016-04-30: 30 mg via INTRAVENOUS
  Filled 2016-04-30: qty 1
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
## 2016-04-30 NOTE — ED Notes (Signed)
Quick vitals done at nurse first desk

## 2016-04-30 NOTE — ED Notes (Signed)
Family at bedside. 

## 2016-04-30 NOTE — ED Notes (Signed)
Patient transported to X-ray 

## 2016-04-30 NOTE — ED Notes (Signed)
Assisted to BR , when arrived in room, c/o of dizziness and room spinning, witnessed fall by nurse and landed on left side, ED PA in to see. No obvious injury. Denies nausea.

## 2016-04-30 NOTE — ED Notes (Signed)
Missed IV attempts x2, tol well

## 2016-04-30 NOTE — ED Notes (Signed)
Pt given d/c instructions as per chart. Rx x 1. Verbalizes understanding. No questions. 

## 2016-04-30 NOTE — ED Notes (Signed)
Given peanut butter crackers and juice

## 2016-04-30 NOTE — ED Notes (Signed)
States has had LUQ pain for years and feels its pop when she lifts something, tearful, regarding ongoing health problems.

## 2016-04-30 NOTE — Discharge Instructions (Signed)
Please read and follow all provided instructions.  Your diagnoses today include:  1. Near syncope   2. Rib pain on left side     Tests performed today include: Vital signs. See below for your results today.   Medications prescribed:  Take as prescribed   Home care instructions:  Follow any educational materials contained in this packet.  Follow-up instructions: Please follow-up with your primary care provider for further evaluation of symptoms and treatment   Return instructions:  Please return to the Emergency Department if you do not get better, if you get worse, or new symptoms OR  - Fever (temperature greater than 101.68F)  - Bleeding that does not stop with holding pressure to the area    -Severe pain (please note that you may be more sore the day after your accident)  - Chest Pain  - Difficulty breathing  - Severe nausea or vomiting  - Inability to tolerate food and liquids  - Passing out  - Skin becoming red around your wounds  - Change in mental status (confusion or lethargy)  - New numbness or weakness    Please return if you have any other emergent concerns.  Additional Information:  Your vital signs today were: BP 133/100 (BP Location: Right Arm)    Pulse 74    Temp 98.1 F (36.7 C) (Oral)    Resp 16    Ht 5\' 2"  (1.575 m)    Wt 107.5 kg    LMP 04/19/2015 (Exact Date)    SpO2 99%    BMI 43.35 kg/m  If your blood pressure (BP) was elevated above 135/85 this visit, please have this repeated by your doctor within one month. ---------------

## 2016-04-30 NOTE — ED Notes (Signed)
Missed IV attempt by Richardson Landry RT, tol well

## 2018-04-16 ENCOUNTER — Emergency Department (HOSPITAL_COMMUNITY): Payer: Federal, State, Local not specified - PPO

## 2018-04-16 ENCOUNTER — Encounter (HOSPITAL_COMMUNITY): Payer: Self-pay | Admitting: Emergency Medicine

## 2018-04-16 ENCOUNTER — Other Ambulatory Visit: Payer: Self-pay

## 2018-04-16 ENCOUNTER — Emergency Department (HOSPITAL_COMMUNITY)
Admission: EM | Admit: 2018-04-16 | Discharge: 2018-04-16 | Disposition: A | Discharge status: Home / Self Care | Payer: Federal, State, Local not specified - PPO | Attending: Emergency Medicine | Admitting: Emergency Medicine

## 2018-04-16 DIAGNOSIS — Z79899 Other long term (current) drug therapy: Secondary | ICD-10-CM | POA: Insufficient documentation

## 2018-04-16 DIAGNOSIS — N10 Acute tubulo-interstitial nephritis: Secondary | ICD-10-CM | POA: Insufficient documentation

## 2018-04-16 DIAGNOSIS — R1032 Left lower quadrant pain: Secondary | ICD-10-CM | POA: Diagnosis present

## 2018-04-16 LAB — URINALYSIS, ROUTINE W REFLEX MICROSCOPIC
BILIRUBIN URINE: NEGATIVE
Glucose, UA: NEGATIVE mg/dL
KETONES UR: NEGATIVE mg/dL
NITRITE: NEGATIVE
Protein, ur: NEGATIVE mg/dL
SPECIFIC GRAVITY, URINE: 1.017 (ref 1.005–1.030)
pH: 5 (ref 5.0–8.0)

## 2018-04-16 LAB — CBC
HCT: 39.8 % (ref 36.0–46.0)
Hemoglobin: 13.7 g/dL (ref 12.0–15.0)
MCH: 28.8 pg (ref 26.0–34.0)
MCHC: 34.4 g/dL (ref 30.0–36.0)
MCV: 83.6 fL (ref 80.0–100.0)
PLATELETS: 228 10*3/uL (ref 150–400)
RBC: 4.76 MIL/uL (ref 3.87–5.11)
RDW: 13.5 % (ref 11.5–15.5)
WBC: 7.8 10*3/uL (ref 4.0–10.5)
nRBC: 0 % (ref 0.0–0.2)

## 2018-04-16 LAB — BASIC METABOLIC PANEL
Anion gap: 7 (ref 5–15)
BUN: 9 mg/dL (ref 6–20)
CHLORIDE: 107 mmol/L (ref 98–111)
CO2: 23 mmol/L (ref 22–32)
Calcium: 8.6 mg/dL — ABNORMAL LOW (ref 8.9–10.3)
Creatinine, Ser: 0.99 mg/dL (ref 0.44–1.00)
GFR calc Af Amer: >60 mL/min (ref >60)
GFR calc non Af Amer: >60 mL/min (ref >60)
GLUCOSE: 108 mg/dL — AB (ref 70–99)
Potassium: 3.8 mmol/L (ref 3.5–5.1)
SODIUM: 137 mmol/L (ref 135–145)

## 2018-04-16 MED ORDER — SODIUM CHLORIDE 0.9 % IV BOLUS
1000.0000 mL | Freq: Once | INTRAVENOUS | Status: AC
Start: 2018-04-16 — End: 2018-04-16
  Administered 2018-04-16: 1000 mL via INTRAVENOUS

## 2018-04-16 MED ORDER — IOHEXOL 300 MG/ML  SOLN
100.0000 mL | Freq: Once | INTRAMUSCULAR | Status: AC
  Administered 2018-04-16: 100 mL via INTRAVENOUS

## 2018-04-16 MED ORDER — CEPHALEXIN 500 MG PO CAPS: 500.0000 mg | ORAL_CAPSULE | Freq: Four times a day (QID) | ORAL | 0 refills | Status: DC

## 2018-04-16 MED ORDER — SODIUM CHLORIDE 0.9 % IV SOLN
1.0000 g | Freq: Once | INTRAVENOUS | Status: AC
  Administered 2018-04-16: 1 g via INTRAVENOUS
  Filled 2018-04-16: qty 10

## 2018-04-16 MED ORDER — ONDANSETRON HCL 4 MG/2ML IJ SOLN
4.0000 mg | Freq: Once | INTRAMUSCULAR | Status: AC
  Administered 2018-04-16: 4 mg via INTRAVENOUS
  Filled 2018-04-16: qty 2

## 2018-04-16 MED ORDER — HYDROMORPHONE HCL 1 MG/ML IJ SOLN
1.0000 mg | Freq: Once | INTRAMUSCULAR | Status: AC
  Administered 2018-04-16: 1 mg via INTRAVENOUS
  Filled 2018-04-16: qty 1

## 2018-04-16 NOTE — ED Triage Notes (Signed)
Patient presents with intermittent left lower pain reported as sharp, and left flank. Reports recent urgency and increase in  incontinence. Denies odor or blood with urination. Reports nausea, denies vomiting.

## 2018-04-16 NOTE — ED Notes (Signed)
ED Provider at bedside. 

## 2018-04-16 NOTE — Discharge Instructions (Signed)
It was our pleasure to provide your ER care today - we hope that you feel better.  The tests show that you have a urine infection.  Take antibiotic as prescribed.   You may take acetaminophen and/or ibuprofen as need for pain.  Follow up with primary care doctor in the next few days if symptoms fail to improve/resolve.  Return to ER if worse, new symptoms, persistent vomiting, other concern.   You were given pain medication in the ER - no driving for the next 6 hours.

## 2018-04-16 NOTE — ED Notes (Signed)
Patient verbalizes understanding of discharge instructions. Opportunity for questioning and answers were provided. Armband removed by staff, pt discharged from ED ambulatory.   

## 2018-04-16 NOTE — ED Notes (Signed)
Patient transported to CT 
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
## 2018-04-18 LAB — URINE CULTURE

## 2018-04-19 ENCOUNTER — Telehealth: Payer: Self-pay

## 2018-04-19 NOTE — Telephone Encounter (Signed)
Post ED Visit - Positive Culture Follow-up  Culture report reviewed by antimicrobial stewardship pharmacist:  []  Elenor Quinones, Pharm.D. []  Heide Guile, Pharm.D., BCPS AQ-ID []  Parks Neptune, Pharm.D., BCPS []  Alycia Rossetti, Pharm.D., BCPS []  Sweetser, Pharm.D., BCPS, AAHIVP []  Legrand Como, Pharm.D., BCPS, AAHIVP []  Salome Arnt, PharmD, BCPS []  Johnnette Gourd, PharmD, BCPS []  Hughes Better, PharmD, BCPS []  Leeroy Cha, PharmD [facility] Pharm D Positive urine culture Treated with Cephalexin, organism sensitive to the same and no further patient follow-up is required at this time.  Genia Del 04/19/2018, 8:42 AM

## 2019-09-24 ENCOUNTER — Other Ambulatory Visit: Payer: Self-pay

## 2019-09-24 ENCOUNTER — Emergency Department (HOSPITAL_COMMUNITY)
Admission: EM | Admit: 2019-09-24 | Discharge: 2019-09-25 | Disposition: A | Discharge status: Home / Self Care | Payer: Federal, State, Local not specified - PPO | Attending: Emergency Medicine | Admitting: Emergency Medicine

## 2019-09-24 ENCOUNTER — Encounter (HOSPITAL_COMMUNITY): Payer: Self-pay

## 2019-09-24 DIAGNOSIS — R1032 Left lower quadrant pain: Secondary | ICD-10-CM | POA: Diagnosis not present

## 2019-09-24 DIAGNOSIS — Z5321 Procedure and treatment not carried out due to patient leaving prior to being seen by health care provider: Secondary | ICD-10-CM | POA: Insufficient documentation

## 2019-09-24 NOTE — ED Notes (Signed)
Pt did not respond when called for vitals check 

## 2019-09-24 NOTE — ED Triage Notes (Signed)
Pt reports LLQ pain that started a few days ago, pt seen at PCP yesterday for it and was told to come here if pain got worse. Pain radiates down to her left leg, states it feels like labor pains. No n/v

## 2019-11-06 IMAGING — CT CT ABD-PELV W/ CM
2 of 5 series · 16 of 46 positions shown, 18 images · IV contrast (omnipaque)
Comparison: None.

CLINICAL DATA: Lower abdominal/pelvic pain and diarrhea

EXAM:
CT ABDOMEN AND PELVIS WITH CONTRAST
TECHNIQUE: Multidetector CT imaging of the abdomen and pelvis was performed
using the standard protocol following bolus administration of
intravenous contrast.
CONTRAST:  100 mL OMNIPAQUE IOHEXOL 300 MG/ML  SOLN

[Series 3: abd/ pelvis 5.0 i30f 2 · axial · 0.92mm/px · z∈[+778,+1163]mm · 13 of 87 slices shown, 15 images]
[im 5/87  soft-tissue]
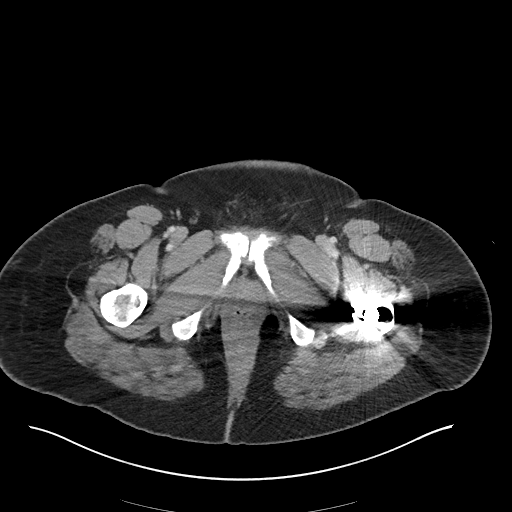
[im 5/87  bone]
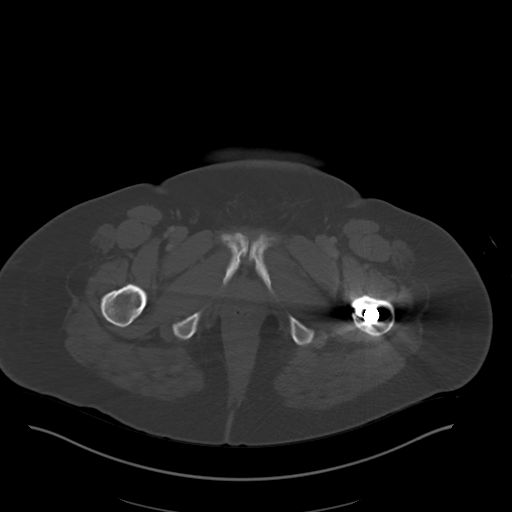
[im 14/87  soft-tissue]
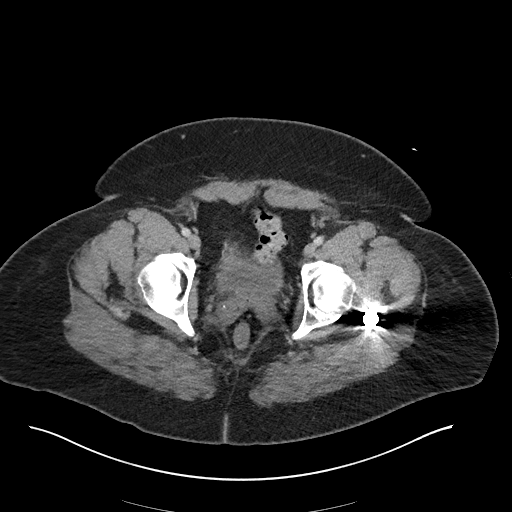
[im 19/87  soft-tissue]
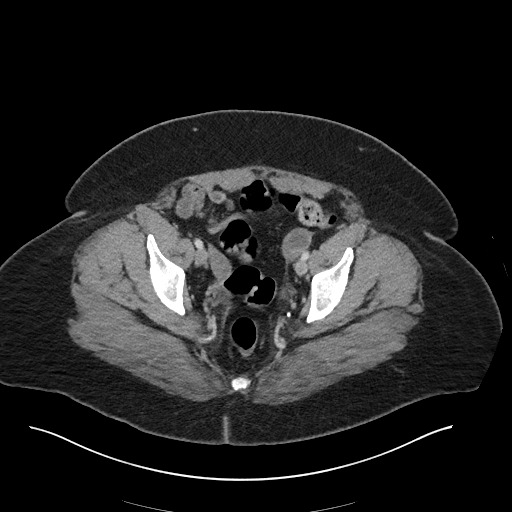
[im 23/87  soft-tissue]
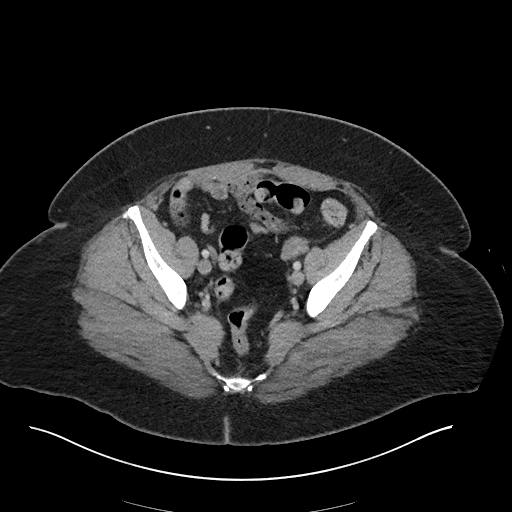
[im 32/87  soft-tissue]
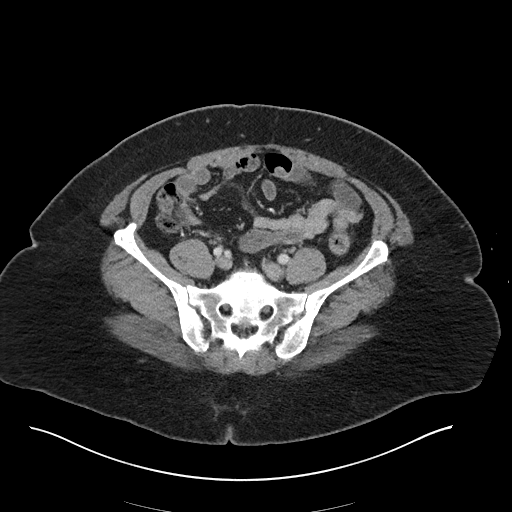
[im 37/87  soft-tissue]
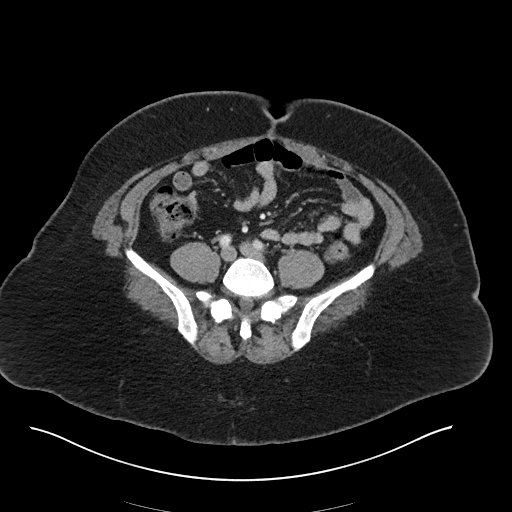
[im 46/87  soft-tissue]
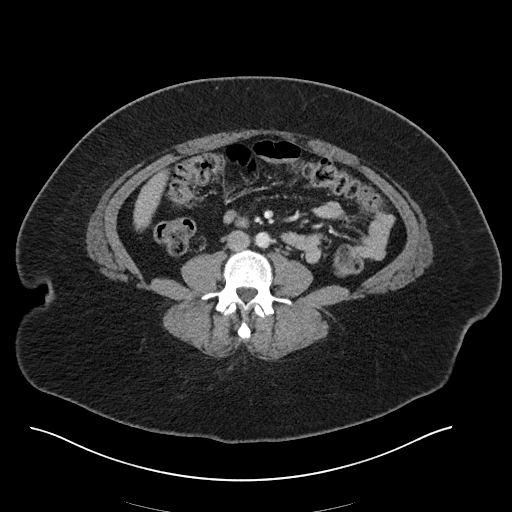
[im 50/87  soft-tissue]
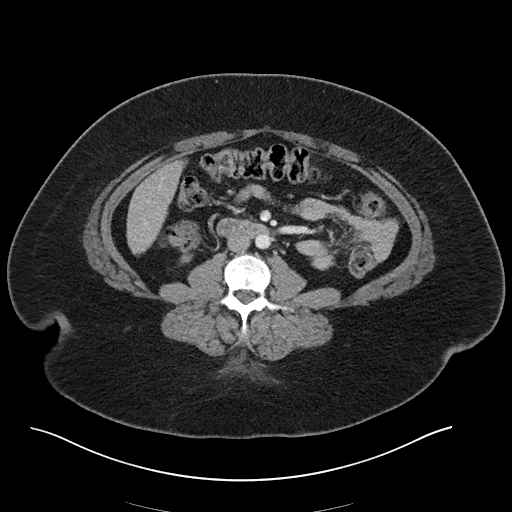
[im 55/87  soft-tissue]
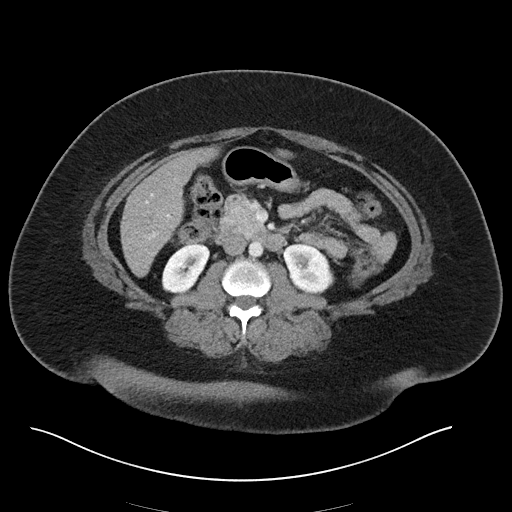
[im 55/87  bone]
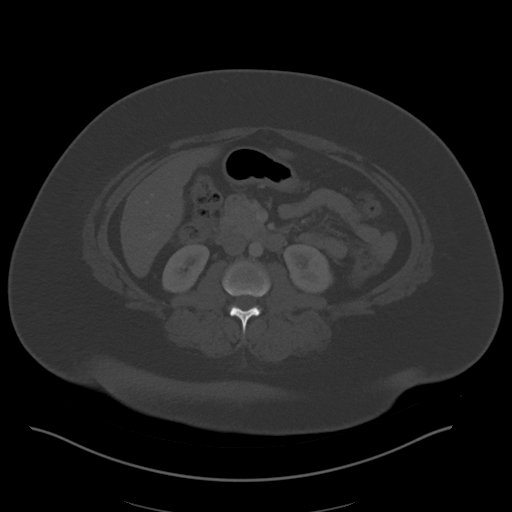
[im 64/87  soft-tissue]
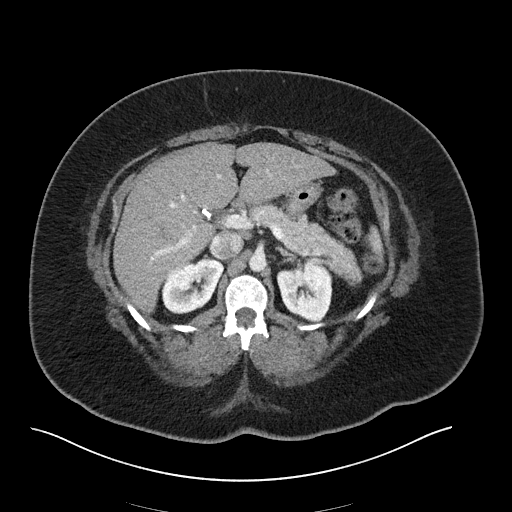
[im 68/87  soft-tissue]
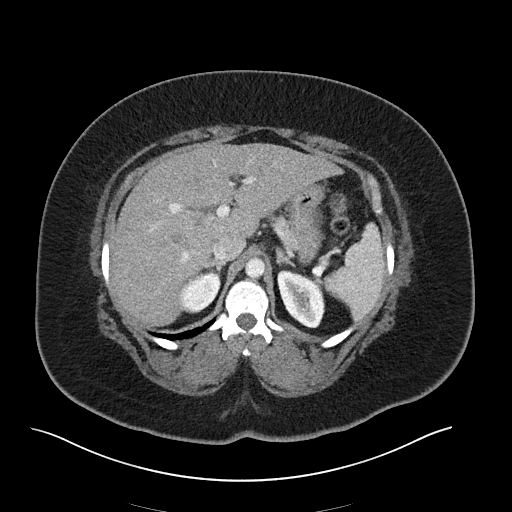
[im 73/87  soft-tissue]
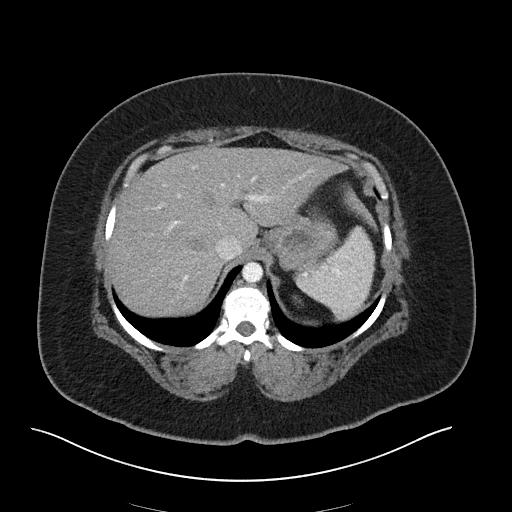
[im 82/87  soft-tissue]
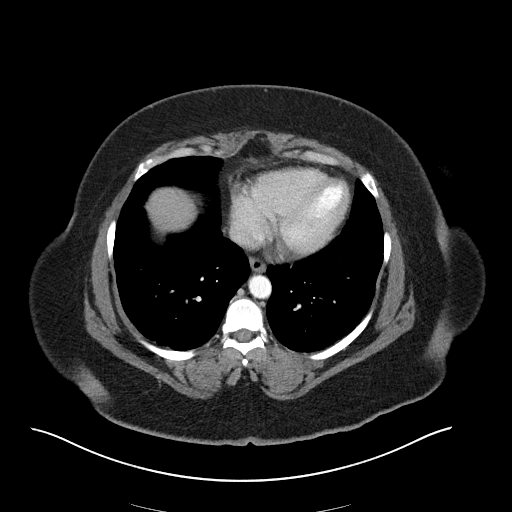

[Series 6: coronal soft tissue · coronal · 0.86mm/px · 3 of 110 slices shown]
[im 37/110  soft-tissue]
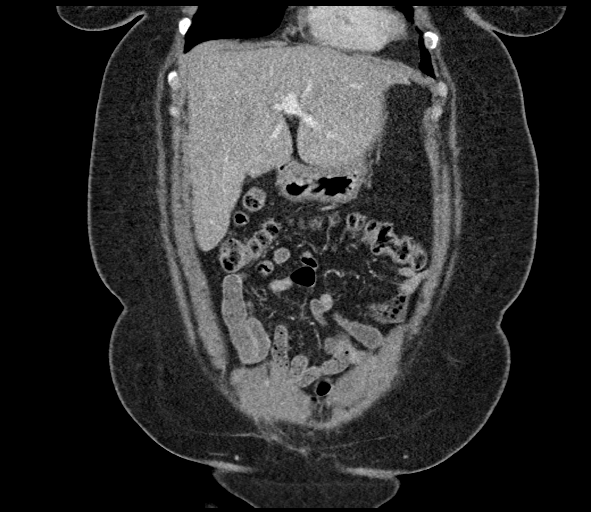
[im 49/110  soft-tissue]
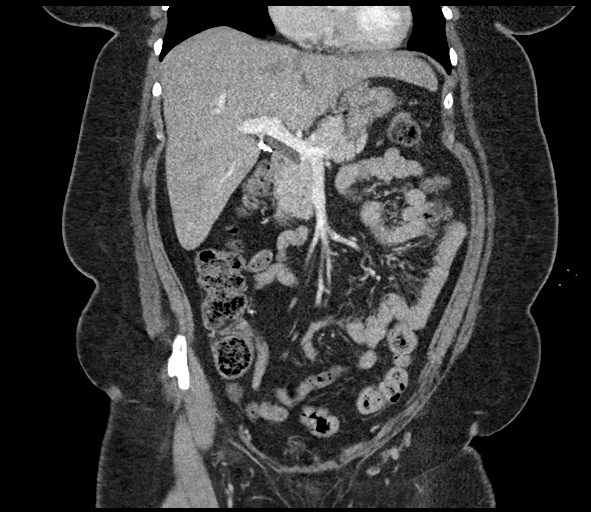
[im 61/110  soft-tissue]
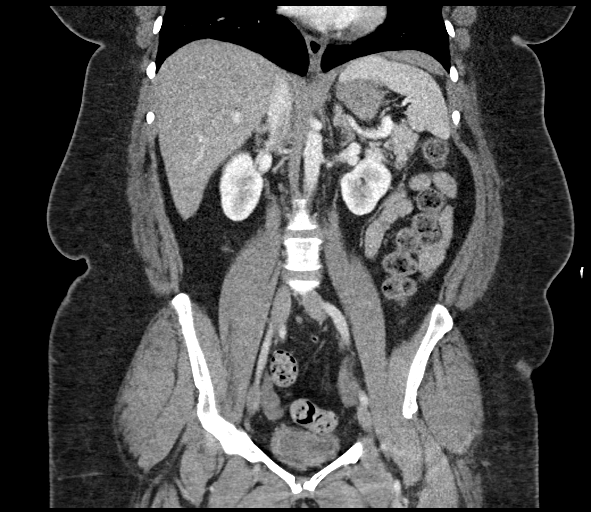

[16 of 46 positions shown; findings below may reference images not displayed]

FINDINGS: Lower chest: No lung base edema or consolidation.

Hepatobiliary: There is a degree of hepatic steatosis. No focal
liver lesions are evident. Gallbladder is absent. There is no
intrahepatic biliary duct dilatation. Common bile duct measures 8 mm
which is within normal limits for post cholecystectomy state. No
biliary duct mass or calculus evident.

Pancreas: No pancreatic mass or inflammatory focus.

Spleen: No splenic lesions are evident.

Adrenals/Urinary Tract: Adrenals bilaterally appear unremarkable.
Kidneys bilaterally show no evident mass or hydronephrosis on either
side. There is no evident renal or ureteral calculus on either side.
Urinary bladder is midline. Urinary bladder wall appears thickened.

Stomach/Bowel: There is no appreciable bowel wall or mesenteric
thickening. There is no appreciable bowel obstruction. No free air
or portal venous air.

Vascular/Lymphatic: There is no abdominal aortic aneurysm. No
vascular lesions appreciable. No adenopathy evident in the abdomen
or pelvis.

Reproductive: The uterus is absent. There is no evident pelvic mass.
Note that ovarian tissue is seen on each side with an apparent
follicle in the left ovary measuring 1.8 x 1.8 cm.

Other: Appendix appears unremarkable. No abscess or ascites is
evident in the abdomen or pelvis.

Musculoskeletal: Postoperative changes noted in the proximal left
femur. No blastic or lytic bone lesions are evident. No
intramuscular or abdominal wall lesions are appreciable.
IMPRESSION: 1.  Urinary bladder wall thickening consistent with cystitis noted.

2. No evident bowel obstruction. No abscess in the abdomen or
pelvis. Appendix appears normal.

3. Gallbladder absent. No appreciable biliary duct dilatation. There
is hepatic steatosis.

4. Uterus absent. Patient by report is status post bilateral
salpingectomy. Ovarian tissue is appreciable on this study on each
side with a follicle noted within the left ovary.

5.  Postoperative change noted in left femur.

## 2020-05-06 ENCOUNTER — Emergency Department (HOSPITAL_COMMUNITY)
Admission: EM | Admit: 2020-05-06 | Discharge: 2020-05-07 | Disposition: A | Discharge status: Home / Self Care | Payer: Federal, State, Local not specified - PPO | Attending: Emergency Medicine | Admitting: Emergency Medicine

## 2020-05-06 ENCOUNTER — Encounter (HOSPITAL_COMMUNITY): Payer: Self-pay | Admitting: Emergency Medicine

## 2020-05-06 ENCOUNTER — Other Ambulatory Visit: Payer: Self-pay

## 2020-05-06 DIAGNOSIS — R1012 Left upper quadrant pain: Secondary | ICD-10-CM

## 2020-05-06 DIAGNOSIS — N83202 Unspecified ovarian cyst, left side: Secondary | ICD-10-CM

## 2020-05-06 DIAGNOSIS — R1032 Left lower quadrant pain: Secondary | ICD-10-CM | POA: Diagnosis present

## 2020-05-06 LAB — CBC
HCT: 41.4 % (ref 36.0–46.0)
Hemoglobin: 14.9 g/dL (ref 12.0–15.0)
MCH: 29.5 pg (ref 26.0–34.0)
MCHC: 36 g/dL (ref 30.0–36.0)
MCV: 82 fL (ref 80.0–100.0)
Platelets: 238 10*3/uL (ref 150–400)
RBC: 5.05 MIL/uL (ref 3.87–5.11)
RDW: 14.3 % (ref 11.5–15.5)
WBC: 6.9 10*3/uL (ref 4.0–10.5)
nRBC: 0 % (ref 0.0–0.2)

## 2020-05-06 LAB — COMPREHENSIVE METABOLIC PANEL
ALT: 22 U/L (ref 0–44)
AST: 26 U/L (ref 15–41)
Albumin: 3.8 g/dL (ref 3.5–5.0)
Alkaline Phosphatase: 47 U/L (ref 38–126)
Anion gap: 11 (ref 5–15)
BUN: 14 mg/dL (ref 6–20)
CO2: 20 mmol/L — ABNORMAL LOW (ref 22–32)
Calcium: 9.3 mg/dL (ref 8.9–10.3)
Chloride: 106 mmol/L (ref 98–111)
Creatinine, Ser: 1.1 mg/dL — ABNORMAL HIGH (ref 0.44–1.00)
GFR, Estimated: >60 mL/min (ref >60)
Glucose, Bld: 128 mg/dL — ABNORMAL HIGH (ref 70–99)
Potassium: 4.1 mmol/L (ref 3.5–5.1)
Sodium: 137 mmol/L (ref 135–145)
Total Bilirubin: 1 mg/dL (ref 0.3–1.2)
Total Protein: 7 g/dL (ref 6.5–8.1)

## 2020-05-06 LAB — URINALYSIS, ROUTINE W REFLEX MICROSCOPIC
Bilirubin Urine: NEGATIVE
Glucose, UA: NEGATIVE mg/dL
Hgb urine dipstick: NEGATIVE
Ketones, ur: NEGATIVE mg/dL
Leukocytes,Ua: NEGATIVE
Nitrite: NEGATIVE
Protein, ur: 30 mg/dL — AB
Specific Gravity, Urine: 1.024 (ref 1.005–1.030)
pH: 5 (ref 5.0–8.0)

## 2020-05-06 LAB — LIPASE, BLOOD: Lipase: 24 U/L (ref 11–51)

## 2020-05-06 NOTE — ED Triage Notes (Addendum)
Pt to ED with c/o left lower abd pain x's 4 days  Pt st's pain has increased each day. Pt denies nausea or vomiting.  Pt also denies diarrhea. Pt also c/o headache

## 2020-05-07 ENCOUNTER — Other Ambulatory Visit: Payer: Self-pay

## 2020-05-07 ENCOUNTER — Emergency Department (HOSPITAL_COMMUNITY): Payer: Federal, State, Local not specified - PPO

## 2020-05-07 DIAGNOSIS — N83202 Unspecified ovarian cyst, left side: Secondary | ICD-10-CM | POA: Diagnosis not present

## 2020-05-07 MED ORDER — OXYCODONE-ACETAMINOPHEN 5-325 MG PO TABS: 1.0000 | ORAL_TABLET | Freq: Three times a day (TID) | ORAL | 0 refills | Status: DC | PRN

## 2020-05-07 MED ORDER — HYDROMORPHONE HCL 1 MG/ML IJ SOLN
1.0000 mg | Freq: Once | INTRAMUSCULAR | Status: AC
  Administered 2020-05-07: 1 mg via INTRAVENOUS
  Filled 2020-05-07: qty 1

## 2020-05-07 MED ORDER — IOHEXOL 300 MG/ML  SOLN
100.0000 mL | Freq: Once | INTRAMUSCULAR | Status: AC | PRN
  Administered 2020-05-07: 100 mL via INTRAVENOUS

## 2020-05-07 MED ORDER — OXYCODONE-ACETAMINOPHEN 5-325 MG PO TABS
1.0000 | ORAL_TABLET | Freq: Once | ORAL | Status: AC
  Administered 2020-05-07: 1 via ORAL
  Filled 2020-05-07: qty 1
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
## 2020-05-07 NOTE — Discharge Instructions (Signed)
Please get a follow-up ultrasound with your provider to ensure this is just a simple cyst rather than cancerous growth.  CT SCAN IMPRESSION: 6 cm left ovarian cystic lesion which needs follow-up at this size. Given the acute symptomatology, recommend pelvic ultrasound in 6-12 weeks

## 2020-05-07 NOTE — ED Notes (Signed)
DC instructions reviewed with patient and patient verbalized understaning. Patient vss and nad. Patient ambulatory to triage.

## 2020-06-10 ENCOUNTER — Other Ambulatory Visit: Payer: Self-pay

## 2020-06-10 ENCOUNTER — Ambulatory Visit (INDEPENDENT_AMBULATORY_CARE_PROVIDER_SITE_OTHER): Payer: 59 | Admitting: Obstetrics & Gynecology

## 2020-06-10 ENCOUNTER — Encounter: Payer: Self-pay | Admitting: Obstetrics & Gynecology

## 2020-06-10 VITALS — BP 137/95 | HR 71 | Wt 246.1 lb

## 2020-06-10 DIAGNOSIS — N83202 Unspecified ovarian cyst, left side: Secondary | ICD-10-CM | POA: Diagnosis not present

## 2020-06-10 DIAGNOSIS — Z1231 Encounter for screening mammogram for malignant neoplasm of breast: Secondary | ICD-10-CM

## 2020-06-10 DIAGNOSIS — Z90710 Acquired absence of both cervix and uterus: Secondary | ICD-10-CM | POA: Diagnosis not present

## 2020-06-10 DIAGNOSIS — D4959 Neoplasm of unspecified behavior of other genitourinary organ: Secondary | ICD-10-CM
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
## 2020-06-11 LAB — FOLLICLE STIMULATING HORMONE: FSH: 6.6 m[IU]/mL

## 2020-06-11 LAB — CA 125: Cancer Antigen (CA) 125: 5.5 U/mL (ref 0.0–38.1)

## 2020-06-15 ENCOUNTER — Telehealth: Payer: Self-pay

## 2020-06-15 DIAGNOSIS — N83202 Unspecified ovarian cyst, left side: Secondary | ICD-10-CM

## 2020-06-15 NOTE — Telephone Encounter (Addendum)
-----   Message from Megan Salon, MD sent at 06/14/2020  2:58 PM EST ----- Please let pt know her [facility] showed she is not in menopause yet.  Therefore, this cyst could be physiologic and could resolve on its own.  Her ca-125 was normal.  This is very likely a benign cyst.  I would like her to have the ultrasound repeated in 8-12 weeks.  If cyst still present at that time, will then discuss if removal.  Ultrasound order has not been placed.    Notified pt of normal CA-125 and FSH.  I informed pt that the provider would like for her to have an U/S scheduled in 8-12 weeks to follow up with the cyst.  Pt informed that her U/S has been scheduled for 08/19/20 @ 0845 @ [facility]. I encouraged pt to get MyChart so that she will be able to get appts/results.  Pt verbalized understanding with no further questions.   Mel Almond, RN  06/16/19

## 2020-08-19 ENCOUNTER — Ambulatory Visit
Admission: RE | Admit: 2020-08-19 | Discharge: 2020-08-19 | Disposition: A | Discharge status: Home / Self Care | Payer: 59 | Source: Ambulatory Visit | Attending: Obstetrics & Gynecology | Admitting: Obstetrics & Gynecology

## 2020-08-19 ENCOUNTER — Other Ambulatory Visit: Payer: Self-pay

## 2020-08-19 DIAGNOSIS — N83202 Unspecified ovarian cyst, left side: Secondary | ICD-10-CM | POA: Diagnosis present

## 2020-09-20 ENCOUNTER — Telehealth: Payer: Self-pay | Admitting: Lactation Services

## 2020-09-20 NOTE — Telephone Encounter (Signed)
[facility] to schedule Mammogram.  Scheduled for June 7 at 12:30. Patient to arrive at 12:15 pm  Called patient to inform her of Mammogram date, time and location.  [facility] phone number given to patient in event she needs to reschedule.   Patient voiced understanding.

## 2020-09-20 NOTE — Telephone Encounter (Signed)
-----   Message from Luvenia Redden, PA-C sent at 09/20/2020  3:49 PM EDT ----- Hello,   Patient had mammogram ordered in January and not scheduled. Can we please schedule?   Thank you!   Almyra Free

## 2020-11-09 ENCOUNTER — Other Ambulatory Visit: Payer: Self-pay

## 2020-11-09 ENCOUNTER — Ambulatory Visit
Admission: RE | Admit: 2020-11-09 | Discharge: 2020-11-09 | Disposition: A | Discharge status: Home / Self Care | Payer: No Typology Code available for payment source | Source: Ambulatory Visit | Attending: Obstetrics & Gynecology | Admitting: Obstetrics & Gynecology

## 2020-11-09 DIAGNOSIS — Z1231 Encounter for screening mammogram for malignant neoplasm of breast: Secondary | ICD-10-CM

## 2020-12-31 NOTE — Addendum Note (Signed)
Encounter addended by: Annie Paras on: 12/31/2020 3:44 PM  Actions taken: Letter saved

## 2021-12-23 ENCOUNTER — Other Ambulatory Visit: Payer: Self-pay | Admitting: Obstetrics & Gynecology

## 2021-12-23 DIAGNOSIS — Z1231 Encounter for screening mammogram for malignant neoplasm of breast: Secondary | ICD-10-CM

## 2022-01-03 ENCOUNTER — Ambulatory Visit
Admission: RE | Admit: 2022-01-03 | Discharge: 2022-01-03 | Disposition: A | Discharge status: Home / Self Care | Payer: No Typology Code available for payment source | Source: Ambulatory Visit | Attending: Obstetrics & Gynecology | Admitting: Obstetrics & Gynecology

## 2022-01-03 DIAGNOSIS — Z1231 Encounter for screening mammogram for malignant neoplasm of breast: Secondary | ICD-10-CM

## 2024-03-14 ENCOUNTER — Emergency Department (HOSPITAL_COMMUNITY)
Admission: EM | Admit: 2024-03-14 | Discharge: 2024-03-15 | Disposition: A | Discharge status: Home / Self Care | Attending: Emergency Medicine | Admitting: Emergency Medicine

## 2024-03-14 ENCOUNTER — Other Ambulatory Visit: Payer: Self-pay

## 2024-03-14 ENCOUNTER — Encounter (HOSPITAL_COMMUNITY): Payer: Self-pay | Admitting: *Deleted

## 2024-03-14 DIAGNOSIS — M791 Myalgia, unspecified site: Secondary | ICD-10-CM | POA: Diagnosis present

## 2024-03-14 DIAGNOSIS — W19XXXA Unspecified fall, initial encounter: Secondary | ICD-10-CM | POA: Diagnosis not present

## 2024-03-14 DIAGNOSIS — Z7982 Long term (current) use of aspirin: Secondary | ICD-10-CM | POA: Insufficient documentation

## 2024-03-14 NOTE — ED Triage Notes (Signed)
 Pt reports fall a couple weeks ago that resulted in a R wrist and R knee injuries. Pt states that her legs just gave out. Sent from PCP.

## 2024-03-15 ENCOUNTER — Emergency Department (HOSPITAL_COMMUNITY)

## 2024-03-15 MED ORDER — OXYCODONE-ACETAMINOPHEN 5-325 MG PO TABS: 1.0000 | ORAL_TABLET | Freq: Once | ORAL | Status: DC

## 2024-03-15 MED ORDER — OXYCODONE-ACETAMINOPHEN 5-325 MG PO TABS: 1.0000 | ORAL_TABLET | Freq: Four times a day (QID) | ORAL | 0 refills | Status: AC | PRN

## 2024-03-15 MED ORDER — KETOROLAC TROMETHAMINE 15 MG/ML IJ SOLN
15.0000 mg | Freq: Once | INTRAMUSCULAR | Status: AC
  Administered 2024-03-15: 15 mg via INTRAMUSCULAR
  Filled 2024-03-15: qty 1
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
## 2024-03-15 NOTE — Discharge Instructions (Signed)
 It was a pleasure taking part in your care.  As we discussed, all of your x-rays are negative for an acute fracture.  Please follow-up with your PCP at your earliest convenience.  To control your pain you may attempt to take Tylenol  or ibuprofen  every 6 hours.  If this does not control pain, you may take 1 tablet of hydrocodone .  Do not drive or operate machinery while taking this medication.  Do not mix with alcohol.  If you develop any new symptoms please return to the ED.
